# Patient Record
Sex: Female | Born: 1945 | Race: White | Hispanic: No | Marital: Married | Smoking: Former smoker
Health system: Southern US, Community
[De-identification: ages and names within clinical notes are randomized; demographics above are authoritative.]

## PROBLEM LIST (undated history)

## (undated) DIAGNOSIS — F419 Anxiety disorder, unspecified: Secondary | ICD-10-CM

## (undated) DIAGNOSIS — F329 Major depressive disorder, single episode, unspecified: Secondary | ICD-10-CM

## (undated) DIAGNOSIS — M1990 Unspecified osteoarthritis, unspecified site: Secondary | ICD-10-CM

## (undated) DIAGNOSIS — I10 Essential (primary) hypertension: Secondary | ICD-10-CM

## (undated) DIAGNOSIS — G4730 Sleep apnea, unspecified: Secondary | ICD-10-CM

## (undated) DIAGNOSIS — E119 Type 2 diabetes mellitus without complications: Secondary | ICD-10-CM

## (undated) DIAGNOSIS — L121 Cicatricial pemphigoid: Secondary | ICD-10-CM

## (undated) DIAGNOSIS — F32A Depression, unspecified: Secondary | ICD-10-CM

## (undated) DIAGNOSIS — N10 Acute tubulo-interstitial nephritis: Secondary | ICD-10-CM

## (undated) DIAGNOSIS — E785 Hyperlipidemia, unspecified: Secondary | ICD-10-CM

## (undated) DIAGNOSIS — Z87442 Personal history of urinary calculi: Secondary | ICD-10-CM

## (undated) DIAGNOSIS — D500 Iron deficiency anemia secondary to blood loss (chronic): Secondary | ICD-10-CM

## (undated) HISTORY — DX: Hyperlipidemia, unspecified: E78.5

## (undated) HISTORY — DX: Depression, unspecified: F32.A

## (undated) HISTORY — DX: Cicatricial pemphigoid: L12.1

## (undated) HISTORY — DX: Essential (primary) hypertension: I10

## (undated) HISTORY — DX: Major depressive disorder, single episode, unspecified: F32.9

## (undated) HISTORY — PX: REPLACEMENT TOTAL KNEE BILATERAL: SUR1225

## (undated) HISTORY — PX: TONSILLECTOMY: SUR1361

## (undated) HISTORY — DX: Iron deficiency anemia secondary to blood loss (chronic): D50.0

## (undated) HISTORY — PX: TOTAL HIP ARTHROPLASTY: SHX124

## (undated) HISTORY — PX: CHOLECYSTECTOMY: SHX55

## (undated) HISTORY — DX: Anxiety disorder, unspecified: F41.9

---

## 2012-03-24 DIAGNOSIS — L121 Cicatricial pemphigoid: Secondary | ICD-10-CM

## 2012-03-24 HISTORY — DX: Cicatricial pemphigoid: L12.1

## 2012-10-22 HISTORY — PX: COLONOSCOPY: SHX174

## 2015-03-25 HISTORY — PX: LITHOTRIPSY: SUR834

## 2015-09-17 DIAGNOSIS — Z96641 Presence of right artificial hip joint: Secondary | ICD-10-CM | POA: Insufficient documentation

## 2015-09-17 DIAGNOSIS — Z96653 Presence of artificial knee joint, bilateral: Secondary | ICD-10-CM | POA: Insufficient documentation

## 2017-07-22 ENCOUNTER — Encounter: Payer: Self-pay | Admitting: Gastroenterology

## 2017-10-19 ENCOUNTER — Telehealth: Payer: Self-pay

## 2017-10-19 ENCOUNTER — Encounter: Payer: Self-pay | Admitting: Gastroenterology

## 2017-10-19 ENCOUNTER — Other Ambulatory Visit: Payer: Self-pay

## 2017-10-19 ENCOUNTER — Encounter

## 2017-10-19 ENCOUNTER — Ambulatory Visit (INDEPENDENT_AMBULATORY_CARE_PROVIDER_SITE_OTHER): Payer: Medicare Other | Admitting: Gastroenterology

## 2017-10-19 DIAGNOSIS — D500 Iron deficiency anemia secondary to blood loss (chronic): Secondary | ICD-10-CM

## 2017-10-19 DIAGNOSIS — R1011 Right upper quadrant pain: Secondary | ICD-10-CM | POA: Diagnosis not present

## 2017-10-19 DIAGNOSIS — Z800 Family history of malignant neoplasm of digestive organs: Secondary | ICD-10-CM

## 2017-10-19 DIAGNOSIS — D509 Iron deficiency anemia, unspecified: Secondary | ICD-10-CM | POA: Insufficient documentation

## 2017-10-19 DIAGNOSIS — R195 Other fecal abnormalities: Secondary | ICD-10-CM | POA: Diagnosis not present

## 2017-10-19 MED ORDER — NA SULFATE-K SULFATE-MG SULF 17.5-3.13-1.6 GM/177ML PO SOLN: 1.0000 | ORAL | 0 refills | Status: DC

## 2017-10-19 NOTE — Progress Notes (Signed)
Primary Care Physician:  Earney Mallet, MD  Primary Gastroenterologist:  Barney Drain, MD   Chief Complaint  Patient presents with  . Abdominal Pain    ruq; comes and goes  . positive occult blood    last tcs 5 yrs ago, due now for tcs    HPI:  Felicia Hogan is a 72 y.o. female here for further evaluation of iron deficiency anemia, Hemoccult positive stool, right upper quadrant pain at the request of Dr. Macarthur Critchley.  Patient states last summer she developed acute onset severe indigestion associated with right upper quadrant pain.  Remote cholecystectomy.  There was concern about possibility of common bile duct stone based on symptoms.  Patient reports having right upper quadrant ultrasound which was unremarkable.  According to the notes from PCP, she had fatty liver. Since that time her pain has settled down, less frequent.  Typically brought on by fatty meal.  She saw her PCP back in April and it was noted that she had a change in her hemoglobin as well as MCV, both declining.  With regards to bowel function, she is always had multiple stools since being on metformin for the past 5 years.  Denies nocturnal stools.  No melena or rectal bleeding.  No nausea or vomiting.  If she eats after dinner she will develop nocturnal reflux for which she takes Zantac.  Dysphagia.  No prior EGD.  Rarely takes Advil for headache.  No aspirin.  Colonoscopy 5 years ago unremarkable.  Father had colon cancer at age 21.  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  Can involve any of the mucous membranes.  Followed at [facility].  See April 2019 labs as outlined below.  In addition according to Dr. Karna Christmas notes, her hemoglobin declined from 12.1-10.4 over 58-month period of time.  Prior to that was 12.7.  MCV declined from 80.4 down to 76.9 but prior to 6 months ago it was 87.2.  Exact dates are not available to me.  Current Outpatient Medications   Medication Sig Dispense Refill  . amLODipine (NORVASC) 5 MG tablet Take 1 tablet by mouth daily.    Marland Kitchen atorvastatin (LIPITOR) 20 MG tablet Take 1 tablet by mouth daily.    . Cholecalciferol (VITAMIN D3) 5000 units TABS Take 1 tablet by mouth daily.    Marland Kitchen desvenlafaxine (PRISTIQ) 100 MG 24 hr tablet Take 50 mg by mouth daily.    Marland Kitchen dexamethasone (DECADRON) 0.5 MG/5ML solution Take 5 mLs by mouth as needed.    . fenofibrate 160 MG tablet Take 1 tablet by mouth daily.    . fluocinonide gel (LIDEX) 7.90 % Apply 1 application topically as needed.    . metFORMIN (GLUCOPHAGE) 1000 MG tablet Take 2,500 mg by mouth daily.    . Multiple Vitamin (MULTIVITAMIN) tablet Take 1 tablet by mouth daily.    . Multiple Vitamins-Minerals (PRESERVISION AREDS PO) Take by mouth 2 (two) times daily.    Marland Kitchen olmesartan-hydrochlorothiazide (BENICAR HCT) 20-12.5 MG tablet Take 1 tablet by mouth daily.    . Omega-3 Fatty Acids (FISH OIL) 1200 MG CAPS Take 1 capsule by mouth 3 (three) times daily.    . ranitidine (ZANTAC) 150 MG tablet Take 150 mg by mouth as needed for heartburn.    . tamsulosin (FLOMAX) 0.4 MG CAPS capsule Take 1 capsule by mouth daily.     No current facility-administered medications for this visit.  Allergies as of 10/19/2017 - Review Complete 10/19/2017  Allergen Reaction Noted  . Nsaids Itching 10/19/2017  . Erythromycin Hives and Rash 10/19/2017  . Sulfa antibiotics Rash 10/19/2017  . Tape Itching and Rash 10/19/2017    Past Medical History:  Diagnosis Date  . Anxiety   . Depression   . Dyslipidemia   . Hypertension   . Iron deficiency anemia due to chronic blood loss   . Mucous membrane pemphigoid 2014   so far only infects mouth. treated with oral steroids for flares. Lidex as well.   . Nephrolithiasis     Past Surgical History:  Procedure Laterality Date  . CESAREAN SECTION     twice  . CHOLECYSTECTOMY    . COLONOSCOPY  10/2012   Dr. West Carbo: normal. due 10/2017.   Marland Kitchen  LITHOTRIPSY  2017  . REPLACEMENT TOTAL KNEE BILATERAL    . TONSILLECTOMY    . TOTAL HIP ARTHROPLASTY Right     Family History  Problem Relation Age of Onset  . Liver cancer Mother        ?started in pancreas  . Parkinson's disease Father   . Pneumonia Father   . Colon cancer Father 52       metastatic but did well  . Thyroid cancer Father   . Other Daughter 10       brain tumor, followed with serial MRIs. now age 90  . Colon cancer Paternal Aunt 90    Social History   Socioeconomic History  . Marital status: Married    Spouse name: Not on file  . Number of children: Not on file  . Years of education: Not on file  . Highest education level: Not on file  Occupational History  . Not on file  Social Needs  . Financial resource strain: Not on file  . Food insecurity:    Worry: Not on file    Inability: Not on file  . Transportation needs:    Medical: Not on file    Non-medical: Not on file  Tobacco Use  . Smoking status: Former Research scientist (life sciences)  . Smokeless tobacco: Never Used  Substance and Sexual Activity  . Alcohol use: Yes    Comment: rare  . Drug use: Never  . Sexual activity: Not on file  Lifestyle  . Physical activity:    Days per week: Not on file    Minutes per session: Not on file  . Stress: Not on file  Relationships  . Social connections:    Talks on phone: Not on file    Gets together: Not on file    Attends religious service: Not on file    Active member of club or organization: Not on file    Attends meetings of clubs or organizations: Not on file    Relationship status: Not on file  . Intimate partner violence:    Fear of current or ex partner: Not on file    Emotionally abused: Not on file    Physically abused: Not on file    Forced sexual activity: Not on file  Other Topics Concern  . Not on file  Social History Narrative  . Not on file      ROS:  General: Negative for anorexia, weight loss, fever, chills, fatigue, weakness. Eyes: Negative  for vision changes.  ENT: Negative for hoarseness, difficulty swallowing , nasal congestion. See hpi CV: Negative for chest pain, angina, palpitations, dyspnea on exertion, peripheral edema.  Respiratory: Negative for dyspnea at rest,  dyspnea on exertion, cough, sputum, wheezing.  GI: See history of present illness. GU:  Negative for dysuria, hematuria, urinary incontinence, urinary frequency, nocturnal urination.  MS: Negative for joint pain, low back pain.  Instability related to joint replacements Derm: Negative for rash or itching.  Intermittent oral ulcers Neuro: Negative for weakness, abnormal sensation, seizure, frequent headaches, memory loss, confusion.  Psych: Negative for anxiety, depression, suicidal ideation, hallucinations.  Endo: Negative for unusual weight change.  Heme: Negative for bruising or bleeding. Allergy: Negative for rash or hives.    Physical Examination:  BP 123/63   Pulse 99   Temp 97.6 F (36.4 C) (Oral)   Ht 5\' 4"  (1.626 m)   Wt 286 lb 6.4 oz (129.9 kg)   BMI 49.16 kg/m    General: Well-nourished, well-developed in no acute distress.  Obese. Head: Normocephalic, atraumatic.   Eyes: Conjunctiva pink, no icterus. Mouth: Oropharyngeal mucosa moist and pink , no lesions erythema or exudate. Neck: Supple without thyromegaly, masses, or lymphadenopathy.  Lungs: Clear to auscultation bilaterally.  Heart: Regular rate and rhythm, no murmurs rubs or gallops.  Abdomen: Bowel sounds are normal, nontender, nondistended, no hepatosplenomegaly or masses, no abdominal bruits or    hernia , no rebound or guarding.  Exam limited by body habitus Rectal: Not performed Extremities: Trace bilateral lower extremity edema. No clubbing or deformities.  Neuro: Alert and oriented x 4 , grossly normal neurologically.  Skin: Warm and dry, no rash or jaundice.   Psych: Alert and cooperative, normal mood and affect.  Labs: Labs from July 10, 2017: White blood cell count  8800, hemoglobin 10.4 low, hematocrit 34.7 normal, MCV 76.9 low, platelets 433,000.  Imaging Studies: No results found.

## 2017-10-19 NOTE — Patient Instructions (Signed)
1. Colonoscopy and upper endoscopy as scheduled. See separate instructions.  2. Your labs will be updated when you go for your pre-op visit.

## 2017-10-19 NOTE — Telephone Encounter (Signed)
Called and informed pt of pre-op appt 12/22/17 at 10:00am. Letter mailed.

## 2017-10-20 ENCOUNTER — Telehealth: Payer: Self-pay | Admitting: Gastroenterology

## 2017-10-20 ENCOUNTER — Other Ambulatory Visit: Payer: Self-pay

## 2017-10-20 DIAGNOSIS — D509 Iron deficiency anemia, unspecified: Secondary | ICD-10-CM

## 2017-10-20 NOTE — Progress Notes (Signed)
cc'ed to pcp °

## 2017-10-20 NOTE — Telephone Encounter (Signed)
Please have patient complete labs now since her TCS/EGD are not until first of 12/2017.   She needs CBC with diff, iron/ferritin/tibc.

## 2017-10-20 NOTE — Telephone Encounter (Signed)
Pt is aware and lab orders have been faxed to her to do at [facility] in [place], [place], with a note to fax results to our number.

## 2017-10-20 NOTE — Assessment & Plan Note (Signed)
72 year old female presenting for further evaluation of iron deficiency anemia, Hemoccult positive stool, right upper quadrant pain.  Intermittent right upper quadrant pain for over a year.  Work-up last year included ultrasound and reportedly had fatty liver.  As outlined above her hemoglobin/hematocrit, MCV all have declined over the past 6 months.  Patient has a family history of colon cancer, father in his 49s.  Last colonoscopy over 5 years ago.  At this time would recommend colonoscopy and upper endoscopy for evaluation of iron deficiency anemia with occult blood loss, right upper quadrant pain.  Deep sedation planned given polypharmacy, obesity and did well with this means of sedation in the past.  I have discussed the risks, alternatives, benefits with regards to but not limited to the risk of reaction to medication, bleeding, infection, perforation and the patient is agreeable to proceed. Written consent to be obtained.   Originally had plan on updating labs during her preop visit however her procedure is scheduled further out than anticipated therefore we will go ahead and update.

## 2017-10-29 LAB — CBC WITH DIFFERENTIAL/PLATELET
Basophils Absolute: 0.1 10*3/uL (ref 0.0–0.2)
Basos: 1 %
EOS (ABSOLUTE): 0.7 10*3/uL — ABNORMAL HIGH (ref 0.0–0.4)
Eos: 9 %
Hematocrit: 33.4 % — ABNORMAL LOW (ref 34.0–46.6)
Hemoglobin: 10.3 g/dL — ABNORMAL LOW (ref 11.1–15.9)
IMMATURE GRANS (ABS): 0 10*3/uL (ref 0.0–0.1)
Immature Granulocytes: 1 %
LYMPHS: 23 %
Lymphocytes Absolute: 1.8 10*3/uL (ref 0.7–3.1)
MCH: 22.5 pg — AB (ref 26.6–33.0)
MCHC: 30.8 g/dL — AB (ref 31.5–35.7)
MCV: 73 fL — ABNORMAL LOW (ref 79–97)
Monocytes Absolute: 0.8 10*3/uL (ref 0.1–0.9)
Monocytes: 9 %
NEUTROS ABS: 4.5 10*3/uL (ref 1.4–7.0)
Neutrophils: 57 %
Platelets: 419 10*3/uL (ref 150–450)
RBC: 4.57 x10E6/uL (ref 3.77–5.28)
RDW: 18.3 % — ABNORMAL HIGH (ref 12.3–15.4)
WBC: 8 10*3/uL (ref 3.4–10.8)

## 2017-10-29 LAB — IRON,TIBC AND FERRITIN PANEL
Ferritin: 9 ng/mL — ABNORMAL LOW (ref 15–150)
Iron Saturation: 6 % — CL (ref 15–55)
Iron: 29 ug/dL (ref 27–139)
TIBC: 491 ug/dL — AB (ref 250–450)
UIBC: 462 ug/dL — ABNORMAL HIGH (ref 118–369)

## 2017-11-09 NOTE — Progress Notes (Signed)
LMOM to call.

## 2017-11-09 NOTE — Progress Notes (Signed)
PT is aware. She is aware we will try to move appt for procedures earlier. Forwarding to [facility] to check out.

## 2017-11-12 ENCOUNTER — Telehealth: Payer: Self-pay

## 2017-11-12 NOTE — Patient Instructions (Signed)
Felicia Hogan  11/12/2017     @PREFPERIOPPHARMACY @   Your procedure is scheduled on  11/17/2017.  Report to [name] at  915   A.M.  Call this number if you have problems the morning of surgery:  [phone]   Remember:  Do not eat or drink after midnight.  You may drink clear liquids until ( follow the instructions given to you).  Clear liquids allowed are:                    Water, Juice (non-citric and without pulp), Carbonated beverages, Clear Tea, Black Coffee only, Plain Jell-O only, Gatorade and Plain Popsicles only    Take these medicines the morning of surgery with A SIP OF WATER  Amlodipine, zyrtec, pristiq, decadron, benicar.    Do not wear jewelry, make-up or nail polish.  Do not wear lotions, powders, or perfumes, or deodorant.  Do not shave 48 hours prior to surgery.  Men may shave face and neck.  Do not bring valuables to the hospital.  [facility] is not responsible for any belongings or valuables.  Contacts, dentures or bridgework may not be worn into surgery.  Leave your suitcase in the car.  After surgery it may be brought to your room.  For patients admitted to the hospital, discharge time will be determined by your treatment team.  Patients discharged the day of surgery will not be allowed to drive home.   Name and phone number of your driver:   family Special instructions:  Follow the diet and prep instructions given to you by Dr [name] office.  Please read over the following fact sheets that you were given. Anesthesia Post-op Instructions and Care and Recovery After Surgery       Esophagogastroduodenoscopy Esophagogastroduodenoscopy (EGD) is a procedure to examine the lining of the esophagus, stomach, and first part of the small intestine (duodenum). This procedure is done to check for problems such as inflammation, bleeding, ulcers, or growths. During this procedure, a long, flexible, lighted tube with a camera attached  (endoscope) is inserted down the throat. Tell a health care provider about:  Any allergies you have.  All medicines you are taking, including vitamins, herbs, eye drops, creams, and over-the-counter medicines.  Any problems you or family members have had with anesthetic medicines.  Any blood disorders you have.  Any surgeries you have had.  Any medical conditions you have.  Whether you are pregnant or may be pregnant. What are the risks? Generally, this is a safe procedure. However, problems may occur, including:  Infection.  Bleeding.  A tear (perforation) in the esophagus, stomach, or duodenum.  Trouble breathing.  Excessive sweating.  Spasms of the larynx.  A slowed heartbeat.  Low blood pressure.  What happens before the procedure?  Follow instructions from your health care provider about eating or drinking restrictions.  Ask your health care provider about: ? Changing or stopping your regular medicines. This is especially important if you are taking diabetes medicines or blood thinners. ? Taking medicines such as aspirin and ibuprofen. These medicines can thin your blood. Do not take these medicines before your procedure if your health care provider instructs you not to.  Plan to have someone take you home after the procedure.  If you wear dentures, be ready to remove them before the procedure. What happens during the procedure?  To reduce your risk of infection,  your health care team will wash or sanitize their hands.  An IV tube will be put in a vein in your hand or arm. You will get medicines and fluids through this tube.  You will be given one or more of the following: ? A medicine to help you relax (sedative). ? A medicine to numb the area (local anesthetic). This medicine may be sprayed into your throat. It will make you feel more comfortable and keep you from gagging or coughing during the procedure. ? A medicine for pain.  A mouth guard may be  placed in your mouth to protect your teeth and to keep you from biting on the endoscope.  You will be asked to lie on your left side.  The endoscope will be lowered down your throat into your esophagus, stomach, and duodenum.  Air will be put into the endoscope. This will help your health care provider see better.  The lining of your esophagus, stomach, and duodenum will be examined.  Your health care provider may: ? Take a tissue sample so it can be looked at in a lab (biopsy). ? Remove growths. ? Remove objects (foreign bodies) that are stuck. ? Treat any bleeding with medicines or other devices that stop tissue from bleeding. ? Widen (dilate) or stretch narrowed areas of your esophagus and stomach.  The endoscope will be taken out. The procedure may vary among health care providers and hospitals. What happens after the procedure?  Your blood pressure, heart rate, breathing rate, and blood oxygen level will be monitored often until the medicines you were given have worn off.  Do not eat or drink anything until the numbing medicine has worn off and your gag reflex has returned. This information is not intended to replace advice given to you by your health care provider. Make sure you discuss any questions you have with your health care provider. Document Released: 07/11/2004 Document Revised: 08/16/2015 Document Reviewed: 02/01/2015 Elsevier Interactive Patient Education  2018 [employer]. Esophagogastroduodenoscopy, Care After Refer to this sheet in the next few weeks. These instructions provide you with information about caring for yourself after your procedure. Your health care provider may also give you more specific instructions. Your treatment has been planned according to current medical practices, but problems sometimes occur. Call your health care provider if you have any problems or questions after your procedure. What can I expect after the procedure? After the procedure,  it is common to have:  A sore throat.  Nausea.  Bloating.  Dizziness.  Fatigue.  Follow these instructions at home:  Do not eat or drink anything until the numbing medicine (local anesthetic) has worn off and your gag reflex has returned. You will know that the local anesthetic has worn off when you can swallow comfortably.  Do not drive for 24 hours if you received a medicine to help you relax (sedative).  If your health care provider took a tissue sample for testing during the procedure, make sure to get your test results. This is your responsibility. Ask your health care provider or the department performing the test when your results will be ready.  Keep all follow-up visits as told by your health care provider. This is important. Contact a health care provider if:  You cannot stop coughing.  You are not urinating.  You are urinating less than usual. Get help right away if:  You have trouble swallowing.  You cannot eat or drink.  You have throat or chest pain that  gets worse.  You are dizzy or light-headed.  You faint.  You have nausea or vomiting.  You have chills.  You have a fever.  You have severe abdominal pain.  You have black, tarry, or bloody stools. This information is not intended to replace advice given to you by your health care provider. Make sure you discuss any questions you have with your health care provider. Document Released: 02/25/2012 Document Revised: 08/16/2015 Document Reviewed: 02/01/2015 Elsevier Interactive Patient Education  2018 [employer].  Colonoscopy, Adult A colonoscopy is an exam to look at the large intestine. It is done to check for problems, such as:  Lumps (tumors).  Growths (polyps).  Swelling (inflammation).  Bleeding.  What happens before the procedure? Eating and drinking Follow instructions from your doctor about eating and drinking. These instructions may include:  A few days before the procedure -  follow a low-fiber diet. ? Avoid nuts. ? Avoid seeds. ? Avoid dried fruit. ? Avoid raw fruits. ? Avoid vegetables.  1-3 days before the procedure - follow a clear liquid diet. Avoid liquids that have red or purple dye. Drink only clear liquids, such as: ? Clear broth or bouillon. ? Black coffee or tea. ? Clear juice. ? Clear soft drinks or sports drinks. ? Gelatin dessert. ? Popsicles.  On the day of the procedure - do not eat or drink anything during the 2 hours before the procedure.  Bowel prep If you were prescribed an oral bowel prep:  Take it as told by your doctor. Starting the day before your procedure, you will need to drink a lot of liquid. The liquid will cause you to poop (have bowel movements) until your poop is almost clear or light green.  If your skin or butt gets irritated from diarrhea, you may: ? Wipe the area with wipes that have medicine in them, such as adult wet wipes with aloe and vitamin E. ? Put something on your skin that soothes the area, such as petroleum jelly.  If you throw up (vomit) while drinking the bowel prep, take a break for up to 60 minutes. Then begin the bowel prep again. If you keep throwing up and you cannot take the bowel prep without throwing up, call your doctor.  General instructions  Ask your doctor about changing or stopping your normal medicines. This is important if you take diabetes medicines or blood thinners.  Plan to have someone take you home from the hospital or clinic. What happens during the procedure?  An IV tube may be put into one of your veins.  You will be given medicine to help you relax (sedative).  To reduce your risk of infection: ? Your doctors will wash their hands. ? Your anal area will be washed with soap.  You will be asked to lie on your side with your knees bent.  Your doctor will get a long, thin, flexible tube ready. The tube will have a camera and a light on the end.  The tube will be put into  your anus.  The tube will be gently put into your large intestine.  Air will be delivered into your large intestine to keep it open. You may feel some pressure or cramping.  The camera will be used to take photos.  A small tissue sample may be removed from your body to be looked at under a microscope (biopsy). If any possible problems are found, the tissue will be sent to a lab for testing.  If  small growths are found, your doctor may remove them and have them checked for cancer.  The tube that was put into your anus will be slowly removed. The procedure may vary among doctors and hospitals. What happens after the procedure?  Your doctor will check on you often until the medicines you were given have worn off.  Do not drive for 24 hours after the procedure.  You may have a small amount of blood in your poop.  You may pass gas.  You may have mild cramps or bloating in your belly (abdomen).  It is up to you to get the results of your procedure. Ask your doctor, or the department performing the procedure, when your results will be ready. This information is not intended to replace advice given to you by your health care provider. Make sure you discuss any questions you have with your health care provider. Document Released: 04/12/2010 Document Revised: 01/09/2016 Document Reviewed: 05/22/2015 Elsevier Interactive Patient Education  2017 [employer]  Colonoscopy, Adult, Care After This sheet gives you information about how to care for yourself after your procedure. Your health care provider may also give you more specific instructions. If you have problems or questions, contact your health care provider. What can I expect after the procedure? After the procedure, it is common to have:  A small amount of blood in your stool for 24 hours after the procedure.  Some gas.  Mild abdominal cramping or bloating.  Follow these instructions at home: General instructions   For the  first 24 hours after the procedure: ? Do not drive or use machinery. ? Do not sign important documents. ? Do not drink alcohol. ? Do your regular daily activities at a slower pace than normal. ? Eat soft, easy-to-digest foods. ? Rest often.  Take over-the-counter or prescription medicines only as told by your health care provider.  It is up to you to get the results of your procedure. Ask your health care provider, or the department performing the procedure, when your results will be ready. Relieving cramping and bloating  Try walking around when you have cramps or feel bloated.  Apply heat to your abdomen as told by your health care provider. Use a heat source that your health care provider recommends, such as a moist heat pack or a heating pad. ? Place a towel between your skin and the heat source. ? Leave the heat on for 20-30 minutes. ? Remove the heat if your skin turns bright red. This is especially important if you are unable to feel pain, heat, or cold. You may have a greater risk of getting burned. Eating and drinking  Drink enough fluid to keep your urine clear or pale yellow.  Resume your normal diet as instructed by your health care provider. Avoid heavy or fried foods that are hard to digest.  Avoid drinking alcohol for as long as instructed by your health care provider. Contact a health care provider if:  You have blood in your stool 2-3 days after the procedure. Get help right away if:  You have more than a small spotting of blood in your stool.  You pass large blood clots in your stool.  Your abdomen is swollen.  You have nausea or vomiting.  You have a fever.  You have increasing abdominal pain that is not relieved with medicine. This information is not intended to replace advice given to you by your health care provider. Make sure you discuss any questions you have  with your health care provider. Document Released: 10/23/2003 Document Revised: 12/03/2015  Document Reviewed: 05/22/2015 Elsevier Interactive Patient Education  2018 [place] Anesthesia is a term that refers to techniques, procedures, and medicines that help a person stay safe and comfortable during a medical procedure. Monitored anesthesia care, or sedation, is one type of anesthesia. Your anesthesia specialist may recommend sedation if you will be having a procedure that does not require you to be unconscious, such as:  Cataract surgery.  A dental procedure.  A biopsy.  A colonoscopy.  During the procedure, you may receive a medicine to help you relax (sedative). There are three levels of sedation:  Mild sedation. At this level, you may feel awake and relaxed. You will be able to follow directions.  Moderate sedation. At this level, you will be sleepy. You may not remember the procedure.  Deep sedation. At this level, you will be asleep. You will not remember the procedure.  The more medicine you are given, the deeper your level of sedation will be. Depending on how you respond to the procedure, the anesthesia specialist may change your level of sedation or the type of anesthesia to fit your needs. An anesthesia specialist will monitor you closely during the procedure. Let your health care provider know about:  Any allergies you have.  All medicines you are taking, including vitamins, herbs, eye drops, creams, and over-the-counter medicines.  Any use of steroids (by mouth or as a cream).  Any problems you or family members have had with sedatives and anesthetic medicines.  Any blood disorders you have.  Any surgeries you have had.  Any medical conditions you have, such as sleep apnea.  Whether you are pregnant or may be pregnant.  Any use of cigarettes, alcohol, or street drugs. What are the risks? Generally, this is a safe procedure. However, problems may occur, including:  Getting too much medicine  (oversedation).  Nausea.  Allergic reaction to medicines.  Trouble breathing. If this happens, a breathing tube may be used to help with breathing. It will be removed when you are awake and breathing on your own.  Heart trouble.  Lung trouble.  Before the procedure Staying hydrated Follow instructions from your health care provider about hydration, which may include:  Up to 2 hours before the procedure - you may continue to drink clear liquids, such as water, clear fruit juice, black coffee, and plain tea.  Eating and drinking restrictions Follow instructions from your health care provider about eating and drinking, which may include:  8 hours before the procedure - stop eating heavy meals or foods such as meat, fried foods, or fatty foods.  6 hours before the procedure - stop eating light meals or foods, such as toast or cereal.  6 hours before the procedure - stop drinking milk or drinks that contain milk.  2 hours before the procedure - stop drinking clear liquids.  Medicines Ask your health care provider about:  Changing or stopping your regular medicines. This is especially important if you are taking diabetes medicines or blood thinners.  Taking medicines such as aspirin and ibuprofen. These medicines can thin your blood. Do not take these medicines before your procedure if your health care provider instructs you not to.  Tests and exams  You will have a physical exam.  You may have blood tests done to show: ? How well your kidneys and liver are working. ? How well your blood can clot.  General  instructions  Plan to have someone take you home from the hospital or clinic.  If you will be going home right after the procedure, plan to have someone with you for 24 hours.  What happens during the procedure?  Your blood pressure, heart rate, breathing, level of pain and overall condition will be monitored.  An IV tube will be inserted into one of your  veins.  Your anesthesia specialist will give you medicines as needed to keep you comfortable during the procedure. This may mean changing the level of sedation.  The procedure will be performed. After the procedure  Your blood pressure, heart rate, breathing rate, and blood oxygen level will be monitored until the medicines you were given have worn off.  Do not drive for 24 hours if you received a sedative.  You may: ? Feel sleepy, clumsy, or nauseous. ? Feel forgetful about what happened after the procedure. ? Have a sore throat if you had a breathing tube during the procedure. ? Vomit. This information is not intended to replace advice given to you by your health care provider. Make sure you discuss any questions you have with your health care provider. Document Released: 12/04/2004 Document Revised: 08/17/2015 Document Reviewed: 07/01/2015 Elsevier Interactive Patient Education  2018 [place], Care After These instructions provide you with information about caring for yourself after your procedure. Your health care provider may also give you more specific instructions. Your treatment has been planned according to current medical practices, but problems sometimes occur. Call your health care provider if you have any problems or questions after your procedure. What can I expect after the procedure? After your procedure, it is common to:  Feel sleepy for several hours.  Feel clumsy and have poor balance for several hours.  Feel forgetful about what happened after the procedure.  Have poor judgment for several hours.  Feel nauseous or vomit.  Have a sore throat if you had a breathing tube during the procedure.  Follow these instructions at home: For at least 24 hours after the procedure:   Do not: ? Participate in activities in which you could fall or become injured. ? Drive. ? Use heavy machinery. ? Drink alcohol. ? Take sleeping pills or  medicines that cause drowsiness. ? Make important decisions or sign legal documents. ? Take care of children on your own.  Rest. Eating and drinking  Follow the diet that is recommended by your health care provider.  If you vomit, drink water, juice, or soup when you can drink without vomiting.  Make sure you have little or no nausea before eating solid foods. General instructions  Have a responsible adult stay with you until you are awake and alert.  Take over-the-counter and prescription medicines only as told by your health care provider.  If you smoke, do not smoke without supervision.  Keep all follow-up visits as told by your health care provider. This is important. Contact a health care provider if:  You keep feeling nauseous or you keep vomiting.  You feel light-headed.  You develop a rash.  You have a fever. Get help right away if:  You have trouble breathing. This information is not intended to replace advice given to you by your health care provider. Make sure you discuss any questions you have with your health care provider. Document Released: 07/01/2015 Document Revised: 10/31/2015 Document Reviewed: 07/01/2015 Elsevier Interactive Patient Education  [employer].

## 2017-11-12 NOTE — Telephone Encounter (Signed)
Endo scheduler called office, TCS/EGD for 11/17/17 moved up to 10:45am. Pt will be told at pre-op appt today.

## 2017-11-13 ENCOUNTER — Encounter (HOSPITAL_COMMUNITY)
Admission: RE | Admit: 2017-11-13 | Discharge: 2017-11-13 | Disposition: A | Discharge status: Home / Self Care | Payer: Medicare Other | Source: Ambulatory Visit | Attending: Gastroenterology | Admitting: Gastroenterology

## 2017-11-13 ENCOUNTER — Other Ambulatory Visit: Payer: Self-pay

## 2017-11-13 ENCOUNTER — Encounter (HOSPITAL_COMMUNITY): Payer: Self-pay

## 2017-11-13 DIAGNOSIS — Z01812 Encounter for preprocedural laboratory examination: Secondary | ICD-10-CM | POA: Diagnosis present

## 2017-11-13 DIAGNOSIS — Z01810 Encounter for preprocedural cardiovascular examination: Secondary | ICD-10-CM | POA: Diagnosis not present

## 2017-11-13 HISTORY — DX: Type 2 diabetes mellitus without complications: E11.9

## 2017-11-13 HISTORY — DX: Unspecified osteoarthritis, unspecified site: M19.90

## 2017-11-13 HISTORY — DX: Sleep apnea, unspecified: G47.30

## 2017-11-13 HISTORY — DX: Personal history of urinary calculi: Z87.442

## 2017-11-13 LAB — BASIC METABOLIC PANEL
Anion gap: 11 (ref 5–15)
BUN: 15 mg/dL (ref 8–23)
CHLORIDE: 100 mmol/L (ref 98–111)
CO2: 25 mmol/L (ref 22–32)
Calcium: 11.3 mg/dL — ABNORMAL HIGH (ref 8.9–10.3)
Creatinine, Ser: 0.8 mg/dL (ref 0.44–1.00)
GFR calc Af Amer: >60 mL/min (ref >60)
GFR calc non Af Amer: >60 mL/min (ref >60)
Glucose, Bld: 139 mg/dL — ABNORMAL HIGH (ref 70–99)
POTASSIUM: 3.9 mmol/L (ref 3.5–5.1)
SODIUM: 136 mmol/L (ref 135–145)

## 2017-11-13 LAB — HEPATIC FUNCTION PANEL
ALT: 29 U/L (ref 0–44)
AST: 19 U/L (ref 15–41)
Albumin: 3.9 g/dL (ref 3.5–5.0)
Alkaline Phosphatase: 223 U/L — ABNORMAL HIGH (ref 38–126)
BILIRUBIN DIRECT: 0.2 mg/dL (ref 0.0–0.2)
BILIRUBIN TOTAL: 1 mg/dL (ref 0.3–1.2)
Indirect Bilirubin: 0.8 mg/dL (ref 0.3–0.9)
Total Protein: 7.7 g/dL (ref 6.5–8.1)

## 2017-11-16 NOTE — Progress Notes (Signed)
Pt is aware and would like labs sent to PCP. She will discuss with him and let us know if she would like a referral to [facility], please send copies to PCP.

## 2017-11-17 ENCOUNTER — Ambulatory Visit (HOSPITAL_COMMUNITY)
Admission: RE | Admit: 2017-11-17 | Discharge: 2017-11-17 | Disposition: A | Discharge status: Home / Self Care | Payer: Medicare Other | Source: Ambulatory Visit | Attending: Gastroenterology | Admitting: Gastroenterology

## 2017-11-17 ENCOUNTER — Ambulatory Visit (HOSPITAL_COMMUNITY): Payer: Medicare Other | Admitting: Anesthesiology

## 2017-11-17 ENCOUNTER — Encounter (HOSPITAL_COMMUNITY): Payer: Self-pay | Admitting: Anesthesiology

## 2017-11-17 ENCOUNTER — Encounter (HOSPITAL_COMMUNITY)
Admission: RE | Disposition: A | Discharge status: Home / Self Care | Payer: Self-pay | Source: Ambulatory Visit | Attending: Gastroenterology

## 2017-11-17 DIAGNOSIS — R1011 Right upper quadrant pain: Secondary | ICD-10-CM | POA: Diagnosis not present

## 2017-11-17 DIAGNOSIS — Q438 Other specified congenital malformations of intestine: Secondary | ICD-10-CM | POA: Diagnosis not present

## 2017-11-17 DIAGNOSIS — T39395A Adverse effect of other nonsteroidal anti-inflammatory drugs [NSAID], initial encounter: Secondary | ICD-10-CM | POA: Insufficient documentation

## 2017-11-17 DIAGNOSIS — F329 Major depressive disorder, single episode, unspecified: Secondary | ICD-10-CM | POA: Insufficient documentation

## 2017-11-17 DIAGNOSIS — Z96641 Presence of right artificial hip joint: Secondary | ICD-10-CM | POA: Insufficient documentation

## 2017-11-17 DIAGNOSIS — K317 Polyp of stomach and duodenum: Secondary | ICD-10-CM | POA: Insufficient documentation

## 2017-11-17 DIAGNOSIS — K449 Diaphragmatic hernia without obstruction or gangrene: Secondary | ICD-10-CM | POA: Diagnosis not present

## 2017-11-17 DIAGNOSIS — Z791 Long term (current) use of non-steroidal anti-inflammatories (NSAID): Secondary | ICD-10-CM | POA: Diagnosis not present

## 2017-11-17 DIAGNOSIS — K2970 Gastritis, unspecified, without bleeding: Secondary | ICD-10-CM

## 2017-11-17 DIAGNOSIS — D123 Benign neoplasm of transverse colon: Secondary | ICD-10-CM | POA: Diagnosis not present

## 2017-11-17 DIAGNOSIS — G4730 Sleep apnea, unspecified: Secondary | ICD-10-CM | POA: Diagnosis not present

## 2017-11-17 DIAGNOSIS — Z7984 Long term (current) use of oral hypoglycemic drugs: Secondary | ICD-10-CM | POA: Insufficient documentation

## 2017-11-17 DIAGNOSIS — E119 Type 2 diabetes mellitus without complications: Secondary | ICD-10-CM | POA: Insufficient documentation

## 2017-11-17 DIAGNOSIS — I10 Essential (primary) hypertension: Secondary | ICD-10-CM | POA: Insufficient documentation

## 2017-11-17 DIAGNOSIS — F419 Anxiety disorder, unspecified: Secondary | ICD-10-CM | POA: Insufficient documentation

## 2017-11-17 DIAGNOSIS — Z96653 Presence of artificial knee joint, bilateral: Secondary | ICD-10-CM | POA: Diagnosis not present

## 2017-11-17 DIAGNOSIS — Z800 Family history of malignant neoplasm of digestive organs: Secondary | ICD-10-CM | POA: Insufficient documentation

## 2017-11-17 DIAGNOSIS — E785 Hyperlipidemia, unspecified: Secondary | ICD-10-CM | POA: Insufficient documentation

## 2017-11-17 DIAGNOSIS — K210 Gastro-esophageal reflux disease with esophagitis: Secondary | ICD-10-CM | POA: Insufficient documentation

## 2017-11-17 DIAGNOSIS — R195 Other fecal abnormalities: Secondary | ICD-10-CM

## 2017-11-17 DIAGNOSIS — D500 Iron deficiency anemia secondary to blood loss (chronic): Secondary | ICD-10-CM

## 2017-11-17 DIAGNOSIS — Z79899 Other long term (current) drug therapy: Secondary | ICD-10-CM | POA: Diagnosis not present

## 2017-11-17 DIAGNOSIS — K648 Other hemorrhoids: Secondary | ICD-10-CM | POA: Diagnosis not present

## 2017-11-17 DIAGNOSIS — K2960 Other gastritis without bleeding: Secondary | ICD-10-CM | POA: Insufficient documentation

## 2017-11-17 DIAGNOSIS — Z87891 Personal history of nicotine dependence: Secondary | ICD-10-CM | POA: Insufficient documentation

## 2017-11-17 DIAGNOSIS — D509 Iron deficiency anemia, unspecified: Secondary | ICD-10-CM | POA: Insufficient documentation

## 2017-11-17 HISTORY — PX: BIOPSY: SHX5522

## 2017-11-17 HISTORY — PX: POLYPECTOMY: SHX5525

## 2017-11-17 HISTORY — PX: ESOPHAGOGASTRODUODENOSCOPY (EGD) WITH PROPOFOL: SHX5813

## 2017-11-17 HISTORY — PX: COLONOSCOPY WITH PROPOFOL: SHX5780

## 2017-11-17 LAB — GLUCOSE, CAPILLARY
Glucose-Capillary: 183 mg/dL — ABNORMAL HIGH (ref 70–99)
Glucose-Capillary: 132 mg/dL — ABNORMAL HIGH (ref 70–99)

## 2017-11-17 SURGERY — COLONOSCOPY WITH PROPOFOL
Anesthesia: General

## 2017-11-17 MED ORDER — PROPOFOL 10 MG/ML IV BOLUS
INTRAVENOUS | Status: DC | PRN
  Administered 2017-11-17: 17 mg via INTRAVENOUS
  Administered 2017-11-17: 10 mg via INTRAVENOUS
  Administered 2017-11-17 (×3): 17 mg via INTRAVENOUS

## 2017-11-17 MED ORDER — GLYCOPYRROLATE 0.2 MG/ML IJ SOLN
INTRAMUSCULAR | Status: DC | PRN
  Administered 2017-11-17: 0.2 mg via INTRAVENOUS

## 2017-11-17 MED ORDER — PROPOFOL 10 MG/ML IV BOLUS
INTRAVENOUS | Status: AC
  Filled 2017-11-17: qty 40

## 2017-11-17 MED ORDER — LIDOCAINE HCL 1 % IJ SOLN
INTRAMUSCULAR | Status: DC | PRN
  Administered 2017-11-17: 40 mg via INTRADERMAL

## 2017-11-17 MED ORDER — FENTANYL CITRATE (PF) 100 MCG/2ML IJ SOLN: 25.0000 ug | INTRAMUSCULAR | Status: DC | PRN

## 2017-11-17 MED ORDER — GLYCOPYRROLATE 0.2 MG/ML IJ SOLN
INTRAMUSCULAR | Status: AC
  Filled 2017-11-17: qty 1

## 2017-11-17 MED ORDER — CHLORHEXIDINE GLUCONATE CLOTH 2 % EX PADS: 6.0000 | MEDICATED_PAD | Freq: Once | CUTANEOUS | Status: DC

## 2017-11-17 MED ORDER — HYDROCODONE-ACETAMINOPHEN 7.5-325 MG PO TABS: 1.0000 | ORAL_TABLET | Freq: Once | ORAL | Status: DC | PRN

## 2017-11-17 MED ORDER — MIDAZOLAM HCL 2 MG/2ML IJ SOLN
INTRAMUSCULAR | Status: AC
  Filled 2017-11-17: qty 2

## 2017-11-17 MED ORDER — MIDAZOLAM HCL 5 MG/5ML IJ SOLN
INTRAMUSCULAR | Status: DC | PRN
  Administered 2017-11-17: 2 mg via INTRAVENOUS

## 2017-11-17 MED ORDER — EPHEDRINE SULFATE 50 MG/ML IJ SOLN
INTRAMUSCULAR | Status: DC | PRN
  Administered 2017-11-17: 5 mg via INTRAVENOUS
  Administered 2017-11-17 (×2): 10 mg via INTRAVENOUS
  Administered 2017-11-17: 5 mg via INTRAVENOUS

## 2017-11-17 MED ORDER — PROPOFOL 500 MG/50ML IV EMUL
INTRAVENOUS | Status: DC | PRN
  Administered 2017-11-17: 125 ug/kg/min via INTRAVENOUS
  Administered 2017-11-17 (×2): via INTRAVENOUS

## 2017-11-17 MED ORDER — LACTATED RINGERS IV SOLN
INTRAVENOUS | Status: DC
  Administered 2017-11-17: 11:00:00 via INTRAVENOUS

## 2017-11-17 MED ORDER — PHENYLEPHRINE HCL 10 MG/ML IJ SOLN
INTRAMUSCULAR | Status: DC | PRN
  Administered 2017-11-17: 60 ug via INTRAVENOUS
  Administered 2017-11-17: 80 ug via INTRAVENOUS
  Administered 2017-11-17: 60 ug via INTRAVENOUS

## 2017-11-17 MED ORDER — LIDOCAINE VISCOUS HCL 2 % MT SOLN
OROMUCOSAL | Status: AC
  Filled 2017-11-17: qty 15

## 2017-11-17 MED ORDER — OMEPRAZOLE 20 MG PO CPDR: DELAYED_RELEASE_CAPSULE | ORAL | 3 refills | Status: DC

## 2017-11-17 MED ORDER — ATROPINE SULFATE 0.4 MG/ML IJ SOLN
INTRAMUSCULAR | Status: AC
  Filled 2017-11-17: qty 1

## 2017-11-17 MED ORDER — PHENYLEPHRINE 40 MCG/ML (10ML) SYRINGE FOR IV PUSH (FOR BLOOD PRESSURE SUPPORT)
PREFILLED_SYRINGE | INTRAVENOUS | Status: AC
  Filled 2017-11-17: qty 10

## 2017-11-17 MED ORDER — LIDOCAINE HCL (PF) 1 % IJ SOLN
INTRAMUSCULAR | Status: AC
  Filled 2017-11-17: qty 5

## 2017-11-17 NOTE — Op Note (Signed)
[facility] Patient Name: Felicia Hogan Procedure Date: 11/17/2017 11:02 AM MRN: [id number] Date of Birth: 1945-03-28 Attending MD: Barney Drain MD, MD CSN: [id number] Age: 72 Admit Type: Outpatient Procedure:                Upper GI endoscopy with COLD FORCEPS BIOPSY Indications:              Iron deficiency anemia: AUG 2019 FERRITIN 9 Hb 10.3                            MCV 73. USE IBUPROFEN W/O PPI. Providers:                Barney Drain MD, MD, Lurline Del, RN, Aram Candela Referring MD:             Earney Mallet Medicines:                Propofol per Anesthesia Complications:            No immediate complications. Estimated Blood Loss:     Estimated blood loss was minimal. Procedure:                Pre-Anesthesia Assessment:                           - Prior to the procedure, a History and Physical                            was performed, and patient medications and                            allergies were reviewed. The patient's tolerance of                            previous anesthesia was also reviewed. The risks                            and benefits of the procedure and the sedation                            options and risks were discussed with the patient.                            All questions were answered, and informed consent                            was obtained. Prior Anticoagulants: The patient has                            taken ibuprofen. ASA Grade Assessment: II - A                            patient with mild systemic disease. After reviewing                            the risks and benefits, the patient was deemed in  satisfactory condition to undergo the procedure.                            After obtaining informed consent, the endoscope was                            passed under direct vision. Throughout the                            procedure, the patient's blood pressure, pulse, and   oxygen saturations were monitored continuously. The                            [id number]-[id number] ([id number]) scope was introduced through the                            mouth, and advanced to the second part of duodenum.                            The upper GI endoscopy was accomplished without                            difficulty. The patient tolerated the procedure                            well. Scope In: 12:05:43 PM Scope Out: 12:15:33 PM Total Procedure Duration: 0 hours 9 minutes 50 seconds  Findings:      LA Grade A (one or more mucosal breaks less than 5 mm, not extending       between tops of 2 mucosal folds) esophagitis with no bleeding was found.      A small hiatal hernia was present.      Patchy mild inflammation characterized by congestion (edema) and       erythema was found in the gastric antrum. Biopsies(2: BODY, 3: ANTRUM)       were taken with a cold forceps for histology FOR ATROPHIC/H PYLORI       GASTRITIS.      Two small sessile polyps were found on the greater curvature of the       stomach. Biopsies(2: BULB: BTL #3, 4: 2ND PORTION: BTL #2) were taken       with a cold forceps for histology.      The examined duodenum was normal. Biopsies for histology were taken with       a cold forceps for evaluation of celiac disease. Impression:               - LA Grade A reflux esophagitis.                           - Small hiatal hernia.                           - RUQ PAIN DUE TO MILD NSAID GASTRITIS                           - Two gastric polyps. Biopsied.                           -  NO OBVIOUS SOURCE FOR IRON DEFICIENCY ANEMIA                            IDENTIFIED. Moderate Sedation:      Per Anesthesia Care Recommendation:           - Patient has a contact number available for                            emergencies. The signs and symptoms of potential                            delayed complications were discussed with the                            patient. Return to normal  activities tomorrow.                            Written discharge instructions were provided to the                            patient.                           - High fiber diet and low fat diet.                           - Continue present medications. ADD OMEPRAZOLE QAC                            BREAKFAST                           - Await pathology results. CONSIDER GIVENS STUDY IF                            NO SOURCE FOR ANEMIA IDENTIFIED.                           - Return to my office in 4 months. Procedure Code(s):        --- Professional ---                           [phone], Esophagogastroduodenoscopy, flexible,                            transoral; with biopsy, single or multiple Diagnosis Code(s):        --- Professional ---                           K21.0, Gastro-esophageal reflux disease with                            esophagitis                           K44.9, Diaphragmatic hernia without obstruction or  gangrene                           K29.70, Gastritis, unspecified, without bleeding                           K31.7, Polyp of stomach and duodenum                           D50.9, Iron deficiency anemia, unspecified CPT copyright 2017 American Medical Association. All rights reserved. The codes documented in this report are preliminary and upon coder review may  be revised to meet current compliance requirements. Barney Drain, MD Barney Drain MD, MD 11/17/2017 12:38:33 PM This report has been signed electronically. Number of Addenda: 0

## 2017-11-17 NOTE — Transfer of Care (Signed)
Immediate Anesthesia Transfer of Care Note  Patient: Felicia Hogan  Procedure(s) Performed: COLONOSCOPY WITH PROPOFOL (N/A ) ESOPHAGOGASTRODUODENOSCOPY (EGD) WITH PROPOFOL (N/A ) POLYPECTOMY BIOPSY  Patient Location: PACU  Anesthesia Type:General  Level of Consciousness: awake, alert  and oriented  Airway & Oxygen Therapy: Patient Spontanous Breathing  Post-op Assessment: Report given to RN  Post vital signs: Reviewed and stable  Last Vitals:  Vitals Value Taken Time  BP    Temp    Pulse 86 11/17/2017 12:24 PM  Resp 26 11/17/2017 12:24 PM  SpO2 94 % 11/17/2017 12:24 PM  Vitals shown include unvalidated device data.  Last Pain:  Vitals:   11/17/17 1005  TempSrc: Oral  PainSc: 0-No pain      Patients Stated Pain Goal: 7 (03/50/09 3818)  Complications: No apparent anesthesia complications

## 2017-11-17 NOTE — H&P (Signed)
Primary Care Physician:  Earney Mallet, MD Primary Gastroenterologist:  Dr. [name]  Pre-Procedure History & Physical: HPI:  Felicia Hogan is a 72 y.o. female here for [place].  Past Medical History:  Diagnosis Date  . Anxiety   . Arthritis   . Depression   . Diabetes mellitus without complication ([place])   . Dyslipidemia   . History of kidney stones   . Hypertension   . Iron deficiency anemia due to chronic blood loss   . Mucous membrane pemphigoid 2014   so far only infects mouth. treated with oral steroids for flares. Lidex as well.   . Sleep apnea     Past Surgical History:  Procedure Laterality Date  . CESAREAN SECTION     twice  . CHOLECYSTECTOMY    . COLONOSCOPY  10/2012   Dr. West Carbo: normal. due 10/2017.   Marland Kitchen LITHOTRIPSY  2017  . REPLACEMENT TOTAL KNEE BILATERAL    . TONSILLECTOMY    . TOTAL HIP ARTHROPLASTY Right     Prior to Admission medications   Medication Sig Start Date End Date Taking? Authorizing Provider  acetaminophen (TYLENOL) 500 MG tablet Take 1,000 mg by mouth 3 (three) times daily as needed for moderate pain or headache.   Yes [provider]  amLODipine (NORVASC) 5 MG tablet Take 5 mg by mouth daily with supper.  07/27/09  Yes [provider]  atorvastatin (LIPITOR) 20 MG tablet Take 10 mg by mouth daily with supper.   Yes [provider]  cetirizine (ZYRTEC) 10 MG tablet Take 10 mg by mouth at bedtime as needed for allergies.   Yes [provider]  Cholecalciferol (VITAMIN D3) 5000 units TABS Take 5,000 Units by mouth daily with supper.    Yes [provider]  desvenlafaxine (PRISTIQ) 50 MG 24 hr tablet Take 50 mg by mouth daily.   Yes [provider]  fenofibrate 160 MG tablet Take 160 mg by mouth daily with lunch.  08/30/15  Yes [provider]  fluocinonide gel (LIDEX) 9.79 % 1 application See admin instructions. Apply orally (dont swallow) 3 times daily as needed for  mouth sores 12/31/09  Yes [provider]  ibuprofen (ADVIL,MOTRIN) 200 MG tablet Take 400 mg by mouth 3 (three) times daily as needed for headache or moderate pain.   Yes [provider]  metFORMIN (GLUCOPHAGE) 1000 MG tablet Take 500-1,000 mg by mouth See admin instructions. Take 1000 mg with breakfast, 500 mg with lunch, and 1000 mg with dinner 05/22/15  Yes [provider]  Multiple Vitamin (MULTIVITAMIN) tablet Take 1 tablet by mouth daily with lunch.    Yes [provider]  Multiple Vitamins-Minerals (PRESERVISION AREDS PO) Take 1 capsule by mouth 2 (two) times daily.    Yes [provider]  Na Sulfate-K Sulfate-Mg Sulf (SUPREP BOWEL PREP KIT) 17.5-3.13-1.6 GM/177ML SOLN Take 1 kit by mouth as directed. 10/19/17  Yes ,  L, MD  olmesartan-hydrochlorothiazide (BENICAR HCT) 20-12.5 MG tablet Take 1 tablet by mouth daily.   Yes [provider]  Omega-3 Fatty Acids (FISH OIL) 1200 MG CAPS Take 1,200 mg by mouth 3 (three) times daily.    Yes [provider]  Polyethyl Glycol-Propyl Glycol (SYSTANE ULTRA OP) Place 1 drop into both eyes 4 (four) times daily as needed (dry eyes).   Yes [provider]  ranitidine (ZANTAC) 150 MG tablet Take 150 mg by mouth at bedtime as needed for heartburn.    Yes [provider]  tamsulosin (FLOMAX) 0.4 MG CAPS capsule Take 0.4 mg by mouth daily as needed (kidney stone).  03/19/16  Yes [provider]  dexamethasone (DECADRON) 0.5 MG/5ML solution Take 5 mLs by mouth 2 (two) times daily as needed (oral sores).  10/12/15   [provider]    Allergies as of 10/19/2017 - Review Complete 10/19/2017  Allergen Reaction Noted  . Nsaids Itching 10/19/2017  . Erythromycin Hives and Rash 10/19/2017  . Sulfa antibiotics Rash 10/19/2017  . Tape Itching and Rash 10/19/2017    Family History  Problem Relation Age of Onset  . Liver cancer Mother        ?started in  pancreas  . Parkinson's disease Father   . Pneumonia Father   . Colon cancer Father 58       metastatic but did well  . Thyroid cancer Father   . Other Daughter 10       brain tumor, followed with serial MRIs. now age 83  . Colon cancer Paternal Aunt 90    Social History   Socioeconomic History  . Marital status: Married    Spouse name: Not on file  . Number of children: Not on file  . Years of education: Not on file  . Highest education level: Not on file  Occupational History  . Not on file  Social Needs  . Financial resource strain: Not on file  . Food insecurity:    Worry: Not on file    Inability: Not on file  . Transportation needs:    Medical: Not on file    Non-medical: Not on file  Tobacco Use  . Smoking status: Former Smoker    Packs/day: 0.25    Years: 1.00    Pack years: 0.25    Types: Cigarettes    Last attempt to quit: 11/14/1967    Years since quitting: 50.0  . Smokeless tobacco: Never Used  Substance and Sexual Activity  . Alcohol use: Yes    Comment: rare  . Drug use: Never  . Sexual activity: Yes    Birth control/protection: Post-menopausal  Lifestyle  . Physical activity:    Days per week: Not on file    Minutes per session: Not on file  . Stress: Not on file  Relationships  . Social connections:    Talks on phone: Not on file    Gets together: Not on file    Attends religious service: Not on file    Active member of club or organization: Not on file    Attends meetings of clubs or organizations: Not on file    Relationship status: Not on file  . Intimate partner violence:    Fear of current or ex partner: Not on file    Emotionally abused: Not on file    Physically abused: Not on file    Forced sexual activity: Not on file  Other Topics Concern  . Not on file  Social History Narrative  . Not on file    Review of Systems: See HPI, otherwise negative ROS   Physical Exam: BP 126/67   Pulse 90   Temp 98.4 F (36.9 C) (Oral)    Resp 20   SpO2 94%  General:   Alert,  pleasant and cooperative in NAD Head:  Normocephalic and atraumatic. Neck:  Supple; Lungs:  Clear throughout to auscultation.    Heart:  Regular rate and rhythm. Abdomen:  Soft, nontender and nondistended. Normal bowel sounds, without guarding, and without  rebound.   Neurologic:  Alert and  oriented x4;  grossly normal neurologically.  Impression/Plan:    IRON DEFICIENCY ANEMIA.  PLAN:  1. TCS/EGD TODAY. DISCUSSED PROCEDURE, BENEFITS, & RISKS: < 1% chance of medication reaction, bleeding, perforation, or rupture of spleen/liver.

## 2017-11-17 NOTE — Anesthesia Postprocedure Evaluation (Signed)
Anesthesia Post Note  Patient: Charliene Inoue  Procedure(s) Performed: COLONOSCOPY WITH PROPOFOL (N/A ) ESOPHAGOGASTRODUODENOSCOPY (EGD) WITH PROPOFOL (N/A ) POLYPECTOMY BIOPSY  Patient location during evaluation: PACU Anesthesia Type: General Level of consciousness: awake and alert and oriented Pain management: pain level controlled Vital Signs Assessment: post-procedure vital signs reviewed and stable Respiratory status: spontaneous breathing Cardiovascular status: blood pressure returned to baseline and stable Postop Assessment: no apparent nausea or vomiting Anesthetic complications: no     Last Vitals:  Vitals:   11/17/17 1005  BP: 126/67  Pulse: 90  Resp: 20  Temp: 36.9 C  SpO2: 94%    Last Pain:  Vitals:   11/17/17 1005  TempSrc: Oral  PainSc: 0-No pain                 ,

## 2017-11-17 NOTE — Anesthesia Preprocedure Evaluation (Signed)
Anesthesia Evaluation  Patient identified by MRN, date of birth, ID band Patient awake    Reviewed: Allergy & Precautions, NPO status , Patient's Chart, lab work & pertinent test results  Airway Mallampati: II  TM Distance: >3 FB Neck ROM: Full    Dental no notable dental hx. (+) Teeth Intact   Pulmonary neg pulmonary ROS, sleep apnea and Continuous Positive Airway Pressure Ventilation , former smoker,    Pulmonary exam normal breath sounds clear to auscultation       Cardiovascular Exercise Tolerance: Good hypertension, Pt. on medications negative cardio ROS Normal cardiovascular examI Rhythm:Regular Rate:Normal     Neuro/Psych Anxiety Depression negative neurological ROS  negative psych ROS   GI/Hepatic negative GI ROS, Neg liver ROS, GERD  Medicated and Controlled,  Endo/Other  negative endocrine ROSdiabetes, Well Controlled, Type 2  Renal/GU negative Renal ROS  negative genitourinary   Musculoskeletal negative musculoskeletal ROS (+) Arthritis , Osteoarthritis,    Abdominal   Peds negative pediatric ROS (+)  Hematology negative hematology ROS (+) anemia ,   Anesthesia Other Findings   Reproductive/Obstetrics negative OB ROS                             Anesthesia Physical Anesthesia Plan  ASA: III  Anesthesia Plan: General   Post-op Pain Management:    Induction: Intravenous  PONV Risk Score and Plan:   Airway Management Planned: Nasal Cannula and Simple Face Mask  Additional Equipment:   Intra-op Plan:   Post-operative Plan:   Informed Consent: I have reviewed the patients History and Physical, chart, labs and discussed the procedure including the risks, benefits and alternatives for the proposed anesthesia with the patient or authorized representative who has indicated his/her understanding and acceptance.   Dental advisory given  Plan Discussed with:  CRNA  Anesthesia Plan Comments:         Anesthesia Quick Evaluation

## 2017-11-17 NOTE — Op Note (Signed)
[facility] Patient Name: Felicia Hogan Procedure Date: 11/17/2017 11:13 AM MRN: [id number] Date of Birth: [birth year] Attending MD: Barney Drain MD, MD CSN: [id number] Age: 72 Admit Type: Outpatient Procedure:                Colonoscopy WITH COLD SNARE POLYPECTOMY Indications:              Iron deficiency anemia Providers:                Barney Drain MD, MD, Lurline Del, RN, Aram Candela Referring MD:             Earney Mallet Medicines:                Propofol per Anesthesia Complications:            No immediate complications. Estimated Blood Loss:     Estimated blood loss was minimal. Procedure:                Pre-Anesthesia Assessment:                           - Prior to the procedure, a History and Physical                            was performed, and patient medications and                            allergies were reviewed. The patient's tolerance of                            previous anesthesia was also reviewed. The risks                            and benefits of the procedure and the sedation                            options and risks were discussed with the patient.                            All questions were answered, and informed consent                            was obtained. Prior Anticoagulants: The patient has                            taken ibuprofen. ASA Grade Assessment: II - A                            patient with mild systemic disease. After reviewing                            the risks and benefits, the patient was deemed in                            satisfactory condition to undergo the procedure.  After obtaining informed consent, the colonoscope                            was passed under direct vision. Throughout the                            procedure, the patient's blood pressure, pulse, and                            oxygen saturations were monitored continuously. The                            [id number]  ([id number]) scope was introduced through                            the anus and advanced to the 5 cm into the ileum.                            The colonoscopy was somewhat difficult due to a                            tortuous colon. Successful completion of the                            procedure was aided by using manual pressure,                            straightening and shortening the scope to obtain                            bowel loop reduction and COLOWRAP. The patient                            tolerated the procedure well. The quality of the                            bowel preparation was excellent. The terminal                            ileum, ileocecal valve, appendiceal orifice, and                            rectum were photographed. Scope In: 11:39:12 AM Scope Out: 11:57:40 AM Scope Withdrawal Time: 0 hours 8 minutes 32 seconds  Total Procedure Duration: 0 hours 18 minutes 28 seconds  Findings:      The terminal ileum appeared normal.      A 6 mm polyp was found in the splenic flexure. The polyp was sessile.       The polyp was removed with a cold snare. Resection and retrieval were       complete.      The recto-sigmoid colon, sigmoid colon and descending colon revealed       significantly excessive looping.      Internal hemorrhoids were found. The hemorrhoids were small. Impression:               -  NO SOURCE FOR IRON DEFICIENCY ANEMIA IDENTIFIED.                           - One 6 mm polyp at the splenic flexure, removed                            with a cold snare. Resected and retrieved.                           - There was significant looping of the colon.                           - Internal hemorrhoids. Moderate Sedation:      Per Anesthesia Care Recommendation:           - Patient has a contact number available for                            emergencies. The signs and symptoms of potential                            delayed complications were discussed  with the                            patient. Return to normal activities tomorrow.                            Written discharge instructions were provided to the                            patient.                           - High fiber diet and low fat diet.                           - Continue present medications.                           - Await pathology results. PROCEED TO EGD.                           - Repeat colonoscopy is not recommended due to                            current age (70 years or older) for surveillance.                           - Return to GI office in 4 months. Procedure Code(s):        --- Professional ---                           [phone], Colonoscopy, flexible; with removal of  tumor(s), polyp(s), or other lesion(s) by snare                            technique Diagnosis Code(s):        --- Professional ---                           K64.8, Other hemorrhoids                           D12.3, Benign neoplasm of transverse colon (hepatic                            flexure or splenic flexure)                           D50.9, Iron deficiency anemia, unspecified CPT copyright 2017 American Medical Association. All rights reserved. The codes documented in this report are preliminary and upon coder review may  be revised to meet current compliance requirements. Barney Drain, MD Barney Drain MD, MD 11/17/2017 12:31:50 PM This report has been signed electronically. Number of Addenda: 0

## 2017-11-17 NOTE — Addendum Note (Signed)
Addendum  created 11/17/17 1304 by Ollen Bowl, CRNA   Charge Capture section accepted

## 2017-11-18 ENCOUNTER — Encounter (HOSPITAL_COMMUNITY): Payer: Self-pay | Admitting: Gastroenterology

## 2017-11-18 NOTE — Discharge Instructions (Signed)
NO OBVIOUS SOURCE FOR YOUR ANEMIA WAS IDENTIFIED. YOUR UPPER ABDOMINAL PAIN IS FROM gastritis DUE TO IBUPROFEN.  You had 1 small polyp removed. You have SMALL internal hemorrhoids. You have ESOPHAGITIS FROM REFLUX, & a SMALL HIATAL HERNIA. I biopsied your stomach, AND SMALL BOWEL.THE LAST PART OF YOUR SMALL BOWEL IS NORMAL.    DRINK WATER TO KEEP YOUR URINE LIGHT YELLOW.  AVOID ITEMS THAT TRIGGER REFLUX. SEE INFO BELOW.  FOLLOW A HIGH FIBER/LOW FAT DIET. AVOID ITEMS THAT CAUSE BLOATING.MEATS SHOULD BE BAKED, BROILED, OR BOILED. AVOID FRIED FOODS. SEE INFO BELOW ON A LOW FAT DIET.   TO PREVENT ULCERS AND GASTRITIS DUE TO DAILY IBUPROFEN USE,  START OMEPRAZOLE.  TAKE 30 MINUTES PRIOR TO YOUR FIRST MEAL.   YOUR BIOPSY RESULTS WILL BE BACK IN 7 DAYS.  FOLLOW UP IN 4 MOS.   We do not routinely screen for polyps after the age of 24.    ENDOSCOPY Care After Read the instructions outlined below and refer to this sheet in the next week. These discharge instructions provide you with general information on caring for yourself after you leave the hospital. While your treatment has been planned according to the most current medical practices available, unavoidable complications occasionally occur. If you have any problems or questions after discharge, call DR. , [phone].  ACTIVITY  You may resume your regular activity, but move at a slower pace for the next 24 hours.   Take frequent rest periods for the next 24 hours.   Walking will help get rid of the air and reduce the bloated feeling in your belly (abdomen).   No driving for 24 hours (because of the medicine (anesthesia) used during the test).   You may shower.   Do not sign any important legal documents or operate any machinery for 24 hours (because of the anesthesia used during the test).    NUTRITION  Drink plenty of fluids.   You may resume your normal diet as instructed by your doctor.   Begin with a light meal  and progress to your normal diet. Heavy or fried foods are harder to digest and may make you feel sick to your stomach (nauseated).   Avoid alcoholic beverages for 24 hours or as instructed.    MEDICATIONS  You may resume your normal medications.   WHAT YOU CAN EXPECT TODAY  Some feelings of bloating in the abdomen.   Passage of more gas than usual.   Spotting of blood in your stool or on the toilet paper  .  IF YOU HAD POLYPS REMOVED DURING THE ENDOSCOPY:  Eat a soft diet IF YOU HAVE NAUSEA, BLOATING, ABDOMINAL PAIN, OR VOMITING.    FINDING OUT THE RESULTS OF YOUR TEST Not all test results are available during your visit. DR. [name] WILL CALL YOU WITHIN 14 DAYS OF YOUR PROCEDUE WITH YOUR RESULTS. Do not assume everything is normal if you have not heard from DR. , CALL HER OFFICE AT [phone].  SEEK IMMEDIATE MEDICAL ATTENTION AND CALL THE OFFICE: [phone] IF:  You have more than a spotting of blood in your stool.   Your belly is swollen (abdominal distention).   You are nauseated or vomiting.   You have a temperature over 101F.   You have abdominal pain or discomfort that is severe or gets worse throughout the day.    Lifestyle and home remedies to control REFLUX/HEARTBURN  You may eliminate or reduce the frequency of heartburn by making the following lifestyle changes:  Control your weight. Being overweight is a major risk factor for heartburn and GERD. Excess pounds put pressure on your abdomen, pushing up your stomach and causing acid to back up into your esophagus.    Eat smaller meals. 4 TO 6 MEALS A DAY. This reduces pressure on the lower esophageal sphincter, helping to prevent the valve from opening and acid from washing back into your esophagus.    Loosen your belt. Clothes that fit tightly around your waist put pressure on your abdomen and the lower esophageal sphincter.    Eliminate heartburn triggers. Everyone has specific triggers.  Common triggers such as fatty or fried foods, spicy food, tomato sauce, carbonated beverages, alcohol, chocolate, mint, garlic, onion, caffeine and nicotine may make heartburn worse.    Avoid stooping or bending. Tying your shoes is OK. Bending over for longer periods to weed your garden isn't, especially soon after eating.    Don't lie down after a meal. Wait at least three to four hours after eating before going to bed, and don't lie down right after eating.    Alternative medicine  Several home remedies exist for treating GERD, but they provide only temporary relief. They include drinking baking soda (sodium bicarbonate) added to water or drinking other fluids such as baking soda mixed with cream of tartar and water.   Although these liquids create temporary relief by neutralizing, washing away or buffering acids, eventually they aggravate the situation by adding gas and fluid to your stomach, increasing pressure and causing more acid reflux. Further, adding more sodium to your diet may increase your blood pressure and add stress to your heart, and excessive bicarbonate ingestion can alter the acid-base balance in your body.    Low-Fat Diet BREADS, CEREALS, PASTA, RICE, DRIED PEAS, AND BEANS These products are high in carbohydrates and most are low in fat. Therefore, they can be increased in the diet as substitutes for fatty foods. They too, however, contain calories and should not be eaten in excess. Cereals can be eaten for snacks as well as for breakfast.   FRUITS AND VEGETABLES It is good to eat fruits and vegetables. Besides being sources of fiber, both are rich in vitamins and some minerals. They help you get the daily allowances of these nutrients. Fruits and vegetables can be used for snacks and desserts.  MEATS Limit lean meat, chicken, [place], and fish to no more than 6 ounces per day. Beef, Pork, and Lamb Use lean cuts of beef, pork, and lamb. Lean cuts include:  Extra-lean  ground beef.  Arm roast.  Sirloin tip.  Center-cut ham.  Round steak.  Loin chops.  Rump roast.  Tenderloin.  Trim all fat off the outside of meats before cooking. It is not necessary to severely decrease the intake of red meat, but lean choices should be made. Lean meat is rich in protein and contains a highly absorbable form of iron. Premenopausal women, in particular, should avoid reducing lean red meat because this could increase the risk for low red blood cells (iron-deficiency anemia).  Chicken and [place] These are good sources of protein. The fat of poultry can be reduced by removing the skin and underlying fat layers before cooking. Chicken and [place] can be substituted for lean red meat in the diet. Poultry should not be fried or covered with high-fat sauces. Fish and Shellfish Fish is a good source of protein. Shellfish contain cholesterol, but they usually are low in saturated fatty acids. The preparation of fish is  important. Like chicken and [place], they should not be fried or covered with high-fat sauces. EGGS Egg whites contain no fat or cholesterol. They can be eaten often. Try 1 to 2 egg whites instead of whole eggs in recipes or use egg substitutes that do not contain yolk. MILK AND DAIRY PRODUCTS Use skim or 1% milk instead of 2% or whole milk. Decrease whole milk, natural, and processed cheeses. Use nonfat or low-fat (2%) cottage cheese or low-fat cheeses made from vegetable oils. Choose nonfat or low-fat (1 to 2%) yogurt. Experiment with evaporated skim milk in recipes that call for heavy cream. Substitute low-fat yogurt or low-fat cottage cheese for sour cream in dips and salad dressings. Have at least 2 servings of low-fat dairy products, such as 2 glasses of skim (or 1%) milk each day to help get your daily calcium intake. FATS AND OILS Reduce the total intake of fats, especially saturated fat. Butterfat, lard, and beef fats are high in saturated fat and cholesterol. These  should be avoided as much as possible. Vegetable fats do not contain cholesterol, but certain vegetable fats, such as coconut oil, palm oil, and palm kernel oil are very high in saturated fats. These should be limited. These fats are often used in bakery goods, processed foods, popcorn, oils, and nondairy creamers. Vegetable shortenings and some peanut butters contain hydrogenated oils, which are also saturated fats. Read the labels on these foods and check for saturated vegetable oils. Unsaturated vegetable oils and fats do not raise blood cholesterol. However, they should be limited because they are fats and are high in calories. Total fat should still be limited to 30% of your daily caloric intake. Desirable liquid vegetable oils are corn oil, cottonseed oil, olive oil, canola oil, safflower oil, soybean oil, and sunflower oil. Peanut oil is not as good, but small amounts are acceptable. Buy a heart-healthy tub margarine that has no partially hydrogenated oils in the ingredients. Mayonnaise and salad dressings often are made from unsaturated fats, but they should also be limited because of their high calorie and fat content. Seeds, nuts, peanut butter, olives, and avocados are high in fat, but the fat is mainly the unsaturated type. These foods should be limited mainly to avoid excess calories and fat. OTHER EATING TIPS Snacks  Most sweets should be limited as snacks. They tend to be rich in calories and fats, and their caloric content outweighs their nutritional value. Some good choices in snacks are graham crackers, melba toast, soda crackers, bagels (no egg), English muffins, fruits, and vegetables. These snacks are preferable to snack crackers, [place] fries, TORTILLA CHIPS, and POTATO chips. Popcorn should be air-popped or cooked in small amounts of liquid vegetable oil. Desserts Eat fruit, low-fat yogurt, and fruit ices instead of pastries, cake, and cookies. Sherbet, angel food cake, gelatin dessert,  frozen low-fat yogurt, or other frozen products that do not contain saturated fat (pure fruit juice bars, frozen ice pops) are also acceptable.  COOKING METHODS Choose those methods that use little or no fat. They include: Poaching.  Braising.  Steaming.  Grilling.  Baking.  Stir-frying.  Broiling.  Microwaving.  Foods can be cooked in a nonstick pan without added fat, or use a nonfat cooking spray in regular cookware. Limit fried foods and avoid frying in saturated fat. Add moisture to lean meats by using water, broth, cooking wines, and other nonfat or low-fat sauces along with the cooking methods mentioned above. Soups and stews should be chilled after cooking. The  fat that forms on top after a few hours in the refrigerator should be skimmed off. When preparing meals, avoid using excess salt. Salt can contribute to raising blood pressure in some people.  EATING AWAY FROM HOME Order entres, potatoes, and vegetables without sauces or butter. When meat exceeds the size of a deck of cards (3 to 4 ounces), the rest can be taken home for another meal. Choose vegetable or fruit salads and ask for low-calorie salad dressings to be served on the side. Use dressings sparingly. Limit high-fat toppings, such as bacon, crumbled eggs, cheese, sunflower seeds, and olives. Ask for heart-healthy tub margarine instead of butter.     High-Fiber Diet A high-fiber diet changes your normal diet to include more whole grains, legumes, fruits, and vegetables. Changes in the diet involve replacing refined carbohydrates with unrefined foods. The calorie level of the diet is essentially unchanged. The Dietary Reference Intake (recommended amount) for adult males is 38 grams per day. For adult females, it is 25 grams per day. Pregnant and lactating women should consume 28 grams of fiber per day. Fiber is the intact part of a plant that is not broken down during digestion. Functional fiber is fiber that has been  isolated from the plant to provide a beneficial effect in the body.  PURPOSE  Increase stool bulk.   Ease and regulate bowel movements.   Lower cholesterol.   REDUCE RISK OF COLON CANCER  INDICATIONS THAT YOU NEED MORE FIBER  Constipation and hemorrhoids.   Uncomplicated diverticulosis (intestine condition) and irritable bowel syndrome.   Weight management.   As a protective measure against hardening of the arteries (atherosclerosis), diabetes, and cancer.   GUIDELINES FOR INCREASING FIBER IN THE DIET  Start adding fiber to the diet slowly. A gradual increase of about 5 more grams (2 slices of whole-wheat bread, 2 servings of most fruits or vegetables, or 1 bowl of high-fiber cereal) per day is best. Too rapid an increase in fiber may result in constipation, flatulence, and bloating.   Drink enough water and fluids to keep your urine clear or pale yellow. Water, juice, or caffeine-free drinks are recommended. Not drinking enough fluid may cause constipation.   Eat a variety of high-fiber foods rather than one type of fiber.   Try to increase your intake of fiber through using high-fiber foods rather than fiber pills or supplements that contain small amounts of fiber.   The goal is to change the types of food eaten. Do not supplement your present diet with high-fiber foods, but replace foods in your present diet.   INCLUDE A VARIETY OF FIBER SOURCES  Replace refined and processed grains with whole grains, canned fruits with fresh fruits, and incorporate other fiber sources. White rice, white breads, and most bakery goods contain little or no fiber.   Brown whole-grain rice, buckwheat oats, and many fruits and vegetables are all good sources of fiber. These include: broccoli, Brussels sprouts, cabbage, cauliflower, beets, sweet potatoes, white potatoes (skin on), carrots, tomatoes, eggplant, squash, berries, fresh fruits, and dried fruits.   Cereals appear to be the richest  source of fiber. Cereal fiber is found in whole grains and bran. Bran is the fiber-rich outer coat of cereal grain, which is largely removed in refining. In whole-grain cereals, the bran remains. In breakfast cereals, the largest amount of fiber is found in those with "bran" in their names. The fiber content is sometimes indicated on the label.   You may need to include  additional fruits and vegetables each day.   In baking, for 1 cup white flour, you may use the following substitutions:   1 cup whole-wheat flour minus 2 tablespoons.   1/2 cup white flour plus 1/2 cup whole-wheat flour.

## 2017-11-19 ENCOUNTER — Encounter (HOSPITAL_COMMUNITY): Payer: Self-pay | Admitting: Gastroenterology

## 2017-11-19 ENCOUNTER — Other Ambulatory Visit: Payer: Self-pay | Admitting: *Deleted

## 2017-11-19 ENCOUNTER — Encounter: Payer: Self-pay | Admitting: *Deleted

## 2017-11-19 DIAGNOSIS — K922 Gastrointestinal hemorrhage, unspecified: Secondary | ICD-10-CM

## 2017-11-19 DIAGNOSIS — D509 Iron deficiency anemia, unspecified: Secondary | ICD-10-CM

## 2017-11-19 NOTE — Progress Notes (Signed)
PT is aware and Ok to schedule the [facility].

## 2017-11-19 NOTE — Progress Notes (Signed)
Patient scheduled.

## 2017-11-20 ENCOUNTER — Encounter: Payer: Self-pay | Admitting: Gastroenterology

## 2017-11-30 ENCOUNTER — Ambulatory Visit (HOSPITAL_COMMUNITY)
Admission: RE | Admit: 2017-11-30 | Discharge: 2017-11-30 | Disposition: A | Discharge status: Home / Self Care | Payer: Medicare Other | Source: Ambulatory Visit | Attending: Gastroenterology | Admitting: Gastroenterology

## 2017-11-30 ENCOUNTER — Encounter (HOSPITAL_COMMUNITY)
Admission: RE | Disposition: A | Discharge status: Home / Self Care | Payer: Self-pay | Source: Ambulatory Visit | Attending: Gastroenterology

## 2017-11-30 DIAGNOSIS — D509 Iron deficiency anemia, unspecified: Secondary | ICD-10-CM | POA: Diagnosis not present

## 2017-11-30 DIAGNOSIS — K922 Gastrointestinal hemorrhage, unspecified: Secondary | ICD-10-CM | POA: Insufficient documentation

## 2017-11-30 DIAGNOSIS — D500 Iron deficiency anemia secondary to blood loss (chronic): Secondary | ICD-10-CM | POA: Diagnosis not present

## 2017-11-30 HISTORY — PX: GIVENS CAPSULE STUDY: SHX5432

## 2017-11-30 SURGERY — IMAGING PROCEDURE, GI TRACT, INTRALUMINAL, VIA CAPSULE

## 2017-12-07 ENCOUNTER — Encounter (HOSPITAL_COMMUNITY): Payer: Self-pay | Admitting: Gastroenterology

## 2017-12-09 ENCOUNTER — Telehealth: Payer: Self-pay | Admitting: *Deleted

## 2017-12-09 NOTE — Telephone Encounter (Signed)
PLEASE CALL PT. HER GIVES RESULTS WILL BE AVAILABLE SEP 19 AFTER 12N.

## 2017-12-09 NOTE — Telephone Encounter (Signed)
Patient aware.

## 2017-12-09 NOTE — Telephone Encounter (Signed)
Patient called requesting her GIVENS capsule study results. Please advise Dr. [name]. thanks

## 2017-12-10 ENCOUNTER — Telehealth: Payer: Self-pay

## 2017-12-10 ENCOUNTER — Other Ambulatory Visit: Payer: Self-pay

## 2017-12-10 DIAGNOSIS — D509 Iron deficiency anemia, unspecified: Secondary | ICD-10-CM

## 2017-12-10 NOTE — Procedures (Signed)
INDICATION: OBSCURE GI BLEED/FEDA(AUG 2019 Hb 10.3, FERRITIN 8)  PATIENT DATA: GASTRIC PASSAGE TIME: 1H 44m, SB PASSAGE TIME: 4H 84m  RESULTS: LIMITED views of gastric mucosa due to retained contents.  OCCASIONAL EROSION IN SMALL BOWEL. No ULCERS, OR masses seen IN SMALL BOWEL.  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 LIMITED VIEWS OF THE COLON DUE TO RETAINED CONTENTS. No old blood or fresh blood in the stomach, small bowel, or colon.  DIAGNOSIS: COLON AVM/NSAID ENTEROPATHY  Plan: 1. AVOID ASPIRIN, BC/GOODY POWDERS, IBUPROFEN/MOTRIN, OR NAPROXEN/ALEVE FOR 3 MOS. 2. EAT FOODS THAT ARE HIGH IN IRON. HANDOUT GIVEN. 3. HAVE CBC/FERRITIN CHECKED IN DEC 2019. 4. 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

## 2017-12-10 NOTE — Telephone Encounter (Addendum)
PLEASE CALL PT. SHE HAS GASTRITIS AND OCCASIONAL EROSION IN HER SMALL BOWEL LIKELY DUE TO IBUPROFEN  AND ONE COLONIC Arteriovenous Malformation(AVMs), A SUPERFICIAL COLLECTION OF BLOOD VESSELS ON THE SURFACE OF THE COLON THAT CAN CAUSE HER TO LOSE BLOOD BUT NOT SEE ANYTHING COME OUT.  An AVM may occur in the STOMACH, COLON, OR SMALL BOWEL. IBUPROFEN CAN MAKE THESE AREAS MORE LIKELY TO BLEED.  HER LAST BLOOD COUNT IN AUG 2019 WAS LOW AT 10.3 AND HER IRON STORES WERE LOW. THIS IS MOST LIKELY DUE TO IBUPROFEN.   SHE SHOULD: 1. AVOID ASPIRIN, BC/GOODY POWDERS, IBUPROFEN/MOTRIN, OR NAPROXEN/ALEVE FOR 3 MOS. 2. EAT FOODS THAT ARE HIGH IN IRON. WE CAN SEND HER A HANDOUT. 3. HAVE CBC/FERRITIN CHECKED IN DEC 2019. 4. 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   High Iron Diet  Purpose Iron is a mineral essential for life. Found in red blood cells, iron's primary role is to carry oxygen from the lungs to the rest of the body. Without oxygen, the body's cells cannot function normally. If the body's iron stores become too low, an iron-deficiency anemia can occur. This is characterized by weakness, lethargy, muscle fatigue, and shortness of breath. In severe cases, a person's skin may become pale due to a lack of red blood cells in the body. In adults, iron deficiency is most commonly caused by chronic blood loss, such as with heavy menstruation or intestinal bleeding from peptic ulcers, cancer, or hemorrhoids. In children, iron deficiency is usually the result of an inadequate iron intake.  Nutrition Facts The recommended dietary allowance (RDA) for iron in healthy adults is 10 milligrams per day for men and 15 milligrams per day for premenopausal women. Premenopausal women's needs are higher than men's needs because women lose iron during menstruation. It is generally easier for men to get enough iron than it is for women. Because they are usually bigger, men have higher calorie  needs and will most likely eat enough food to meet their iron requirements. Women, on the other hand, tend to eat less. This makes it more difficult for them to meet their iron needs. It is, therefore, particularly important for premenopausal women to eat foods high in iron. Pregnant women will need as much as 30 milligrams of iron per day. The main reason is because the unborn baby needs iron for development. As a result, it will draw from the mother's iron stores. This can quickly deplete a woman of iron if she is not eating enough iron rich foods. The following table lists foods high in iron. In general, meat, fish, and poultry are excellent sources. Other sources of iron include beans, dried fruits, whole grains, fortified cereals, and enriched breads.   Special Considerations  1. Heme and nonhemd iron are two forms of iron in foods. Heme iron is found in meats, poultry, and fish. NonHeme iron is found in both plant and animal foods. Heme iron is more easily absorbed by the body than nonheme iron. However, heme iron can also promote the absorption of non-heme iron. Therefore, eating beef and beans, for example, is good for providing adequate absorption of both types of iron. 2. Vitamin C also promotes iron absorption. This is true for both heme and nonheme iron. It is, therefore, beneficial to consume citrus fruits or juices, which are high in vitamin C, with foods that contain iron. For example, a meal might include a lean sirloin steak (heme iron source), baked potato (nonheme iron  source , broccoli (nonheme iroj source), and an orange (vitamin C source) for a good iron intake. 3. Phytic and tannic aids are two food components that, when consumed in large amounts, prevent the abrorption of iron. Phytic acid is found in rye bread and other foods made from whole grains. Phytic acid is also found in nonherbal teas. Tannic acid is found in commercial black and pekoe teas, coffee, cola drinks, chocolate,  and red wines. 4. Iron SupplementsThere are many different kinds of iron supplements. However, iron supplemants should only be taken when there is a true deficiency of iron and only under medical supervision. General multivitamins often have iron and other minerals added to them in moderate amounts. If otherwise healthy, this amount of iron is probably not harmful. If iron is to be avoided, multivitamins containing iron should not be used.Please note that it is important to keep iron and multivita-in supplements safely away from a child's reach. If ingested, severe poisoning can occur.   Foods That Contain Iron  Food Serving Size (mg)  Bran flakes cereal 1 cup 24.0  Product 19 cereal 1 cup 24.0  Clams, steamed 3 oz 23.8  Total cereal 1 cup 18.0  Life cereal 1 cup 12.2  Raisin bran cereal 1 cup 9.3  Beef liver, braised 3 oz 5.8  Kix cereal 1 cup 5.4  Cheerios cereal 1 cup 3.6  Prune juice 1 cup 3.0  Potato, baked with skin 1 med 2.8  Sirloin steak, cooked 3 oz 2.8  Shrimp, cooked 3 oz 2.6  Navy beans, cooked 1/2 cup 2.3  Figs, dried 5 2.1  Lean ground beef, broiled 3 oz 2.1  Swiss chard, cooked 1/2 cup 2.0  Rice krispies cereal 1 cup 1.8  Kidney beans 1/2 cup 1.6  Oatmeal, cooked 1/2 cup 1.6  Spinach, raw 1 cup 1.5  Tuna, canned in water 3 oz 1.3  Green peas, conked 1/2 cup 1.2  Halibut, cooked 3 oz 0.9  Whole-wheat bread 1 slice 0.9  Apricot halves, dried 5 0.8  Raisins 1/4 cup 0.8  Broccoli, cooked 1/2 cup 0.6  Egg, boiled 1 large 0.6    .

## 2017-12-10 NOTE — Telephone Encounter (Signed)
Pt said her appt has been rescheduled several times. She is aware it is currently with Randall Hiss and she would like to have it rescheduled to be with Magda Paganini. She has seen Magda Paganini before and is comfortable with her.  Forwarding to [place] to reschedule.

## 2017-12-10 NOTE — Telephone Encounter (Signed)
REVIEWED-NO ADDITIONAL RECOMMENDATIONS. 

## 2017-12-10 NOTE — Telephone Encounter (Signed)
PT is aware of results and plan. However, she said she does not take NSAIDS. She uses ES Tylenol if she needs it.  She is aware we will do the Blood work in Dec.  She was scheduled OV with EG and separate note to [place] to change to Neil Crouch, PA.  She has seen Magda Paganini before and is comfortable with her.  Handout mailed on iron foods.

## 2017-12-14 NOTE — Telephone Encounter (Signed)
PATIENT ON RECALL  °

## 2017-12-14 NOTE — Telephone Encounter (Signed)
On recall  °

## 2017-12-14 NOTE — Telephone Encounter (Signed)
CALLED PATIENT AND SHE IS ON A RECALL FOR APPT WITH [facility]

## 2017-12-22 ENCOUNTER — Other Ambulatory Visit (HOSPITAL_COMMUNITY): Payer: Medicare Other

## 2018-01-24 ENCOUNTER — Emergency Department (HOSPITAL_COMMUNITY): Payer: Medicare Other

## 2018-01-24 ENCOUNTER — Inpatient Hospital Stay (HOSPITAL_COMMUNITY)
Admission: EM | Admit: 2018-01-24 | Discharge: 2018-01-27 | DRG: 690 | Disposition: A | Discharge status: Home / Self Care | Payer: Medicare Other | Attending: Internal Medicine | Admitting: Internal Medicine

## 2018-01-24 ENCOUNTER — Encounter (HOSPITAL_COMMUNITY): Payer: Self-pay | Admitting: Emergency Medicine

## 2018-01-24 ENCOUNTER — Other Ambulatory Visit: Payer: Self-pay

## 2018-01-24 DIAGNOSIS — E876 Hypokalemia: Secondary | ICD-10-CM | POA: Diagnosis present

## 2018-01-24 DIAGNOSIS — E119 Type 2 diabetes mellitus without complications: Secondary | ICD-10-CM | POA: Diagnosis present

## 2018-01-24 DIAGNOSIS — Z1611 Resistance to penicillins: Secondary | ICD-10-CM | POA: Diagnosis present

## 2018-01-24 DIAGNOSIS — E785 Hyperlipidemia, unspecified: Secondary | ICD-10-CM | POA: Diagnosis present

## 2018-01-24 DIAGNOSIS — F418 Other specified anxiety disorders: Secondary | ICD-10-CM | POA: Diagnosis present

## 2018-01-24 DIAGNOSIS — J0110 Acute frontal sinusitis, unspecified: Secondary | ICD-10-CM | POA: Diagnosis present

## 2018-01-24 DIAGNOSIS — Z6841 Body Mass Index (BMI) 40.0 and over, adult: Secondary | ICD-10-CM

## 2018-01-24 DIAGNOSIS — Z79899 Other long term (current) drug therapy: Secondary | ICD-10-CM

## 2018-01-24 DIAGNOSIS — Z1629 Resistance to other single specified antibiotic: Secondary | ICD-10-CM | POA: Diagnosis present

## 2018-01-24 DIAGNOSIS — Z91048 Other nonmedicinal substance allergy status: Secondary | ICD-10-CM

## 2018-01-24 DIAGNOSIS — Z96641 Presence of right artificial hip joint: Secondary | ICD-10-CM | POA: Diagnosis present

## 2018-01-24 DIAGNOSIS — Z882 Allergy status to sulfonamides status: Secondary | ICD-10-CM | POA: Diagnosis not present

## 2018-01-24 DIAGNOSIS — Z881 Allergy status to other antibiotic agents status: Secondary | ICD-10-CM

## 2018-01-24 DIAGNOSIS — N12 Tubulo-interstitial nephritis, not specified as acute or chronic: Secondary | ICD-10-CM | POA: Diagnosis present

## 2018-01-24 DIAGNOSIS — Z886 Allergy status to analgesic agent status: Secondary | ICD-10-CM

## 2018-01-24 DIAGNOSIS — I10 Essential (primary) hypertension: Secondary | ICD-10-CM | POA: Diagnosis present

## 2018-01-24 DIAGNOSIS — Z96653 Presence of artificial knee joint, bilateral: Secondary | ICD-10-CM | POA: Diagnosis present

## 2018-01-24 DIAGNOSIS — N10 Acute tubulo-interstitial nephritis: Principal | ICD-10-CM

## 2018-01-24 DIAGNOSIS — Z7984 Long term (current) use of oral hypoglycemic drugs: Secondary | ICD-10-CM

## 2018-01-24 HISTORY — DX: Acute pyelonephritis: N10

## 2018-01-24 LAB — COMPREHENSIVE METABOLIC PANEL
ALK PHOS: 57 U/L (ref 38–126)
ALT: 35 U/L (ref 0–44)
AST: 31 U/L (ref 15–41)
Albumin: 3.1 g/dL — ABNORMAL LOW (ref 3.5–5.0)
Anion gap: 9 (ref 5–15)
BUN: 18 mg/dL (ref 8–23)
CALCIUM: 9.2 mg/dL (ref 8.9–10.3)
CHLORIDE: 100 mmol/L (ref 98–111)
CO2: 24 mmol/L (ref 22–32)
CREATININE: 0.91 mg/dL (ref 0.44–1.00)
Glucose, Bld: 232 mg/dL — ABNORMAL HIGH (ref 70–99)
Potassium: 3.3 mmol/L — ABNORMAL LOW (ref 3.5–5.1)
Sodium: 133 mmol/L — ABNORMAL LOW (ref 135–145)
Total Bilirubin: 1.1 mg/dL (ref 0.3–1.2)
Total Protein: 6.7 g/dL (ref 6.5–8.1)

## 2018-01-24 LAB — CBC WITH DIFFERENTIAL/PLATELET
Abs Immature Granulocytes: 0.13 10*3/uL — ABNORMAL HIGH (ref 0.00–0.07)
Basophils Absolute: 0.1 10*3/uL (ref 0.0–0.1)
Basophils Relative: 0 %
EOS ABS: 0 10*3/uL (ref 0.0–0.5)
EOS PCT: 0 %
HEMATOCRIT: 34.2 % — AB (ref 36.0–46.0)
Hemoglobin: 10.4 g/dL — ABNORMAL LOW (ref 12.0–15.0)
Immature Granulocytes: 1 %
LYMPHS ABS: 1 10*3/uL (ref 0.7–4.0)
LYMPHS PCT: 8 %
MCH: 23.9 pg — AB (ref 26.0–34.0)
MCHC: 30.4 g/dL (ref 30.0–36.0)
MCV: 78.4 fL — AB (ref 80.0–100.0)
Monocytes Absolute: 1.2 10*3/uL — ABNORMAL HIGH (ref 0.1–1.0)
Monocytes Relative: 11 %
NRBC: 0 % (ref 0.0–0.2)
Neutro Abs: 9.3 10*3/uL — ABNORMAL HIGH (ref 1.7–7.7)
Neutrophils Relative %: 80 %
PLATELETS: 249 10*3/uL (ref 150–400)
RBC: 4.36 MIL/uL (ref 3.87–5.11)
RDW: 25.1 % — AB (ref 11.5–15.5)
WBC: 11.7 10*3/uL — ABNORMAL HIGH (ref 4.0–10.5)

## 2018-01-24 LAB — GLUCOSE, CAPILLARY: GLUCOSE-CAPILLARY: 158 mg/dL — AB (ref 70–99)

## 2018-01-24 LAB — LIPASE, BLOOD: Lipase: 19 U/L (ref 11–51)

## 2018-01-24 LAB — URINALYSIS, ROUTINE W REFLEX MICROSCOPIC
Bilirubin Urine: NEGATIVE
GLUCOSE, UA: 50 mg/dL — AB
KETONES UR: 5 mg/dL — AB
Nitrite: NEGATIVE
Protein, ur: 100 mg/dL — AB
RBC / HPF: >50 RBC/hpf — ABNORMAL HIGH (ref 0–5)
Specific Gravity, Urine: 1.015 (ref 1.005–1.030)
pH: 5 (ref 5.0–8.0)

## 2018-01-24 LAB — MAGNESIUM: Magnesium: 1.4 mg/dL — ABNORMAL LOW (ref 1.7–2.4)

## 2018-01-24 MED ORDER — ONDANSETRON HCL 4 MG PO TABS
4.0000 mg | ORAL_TABLET | Freq: Four times a day (QID) | ORAL | Status: DC | PRN
  Administered 2018-01-26: 4 mg via ORAL
  Filled 2018-01-24: qty 1

## 2018-01-24 MED ORDER — AMLODIPINE BESYLATE 5 MG PO TABS
5.0000 mg | ORAL_TABLET | Freq: Every day | ORAL | Status: DC
  Administered 2018-01-24 – 2018-01-26 (×3): 5 mg via ORAL
  Filled 2018-01-24 (×3): qty 1

## 2018-01-24 MED ORDER — ACETAMINOPHEN 650 MG RE SUPP: 650.0000 mg | Freq: Four times a day (QID) | RECTAL | Status: DC | PRN

## 2018-01-24 MED ORDER — ONDANSETRON HCL 4 MG/2ML IJ SOLN: 4.0000 mg | Freq: Four times a day (QID) | INTRAMUSCULAR | Status: DC | PRN

## 2018-01-24 MED ORDER — VENLAFAXINE HCL ER 75 MG PO CP24
75.0000 mg | ORAL_CAPSULE | Freq: Every day | ORAL | Status: DC
  Administered 2018-01-25 – 2018-01-27 (×3): 75 mg via ORAL
  Filled 2018-01-24 (×3): qty 1

## 2018-01-24 MED ORDER — ACETAMINOPHEN 325 MG PO TABS
650.0000 mg | ORAL_TABLET | Freq: Four times a day (QID) | ORAL | Status: DC | PRN
  Administered 2018-01-25 – 2018-01-27 (×5): 650 mg via ORAL
  Filled 2018-01-24 (×5): qty 2

## 2018-01-24 MED ORDER — FENTANYL CITRATE (PF) 100 MCG/2ML IJ SOLN
25.0000 ug | Freq: Once | INTRAMUSCULAR | Status: AC
  Administered 2018-01-24: 25 ug via INTRAVENOUS
  Filled 2018-01-24: qty 2

## 2018-01-24 MED ORDER — INSULIN ASPART 100 UNIT/ML ~~LOC~~ SOLN
0.0000 [IU] | Freq: Three times a day (TID) | SUBCUTANEOUS | Status: DC
  Administered 2018-01-25: 1 [IU] via SUBCUTANEOUS
  Administered 2018-01-25 (×2): 2 [IU] via SUBCUTANEOUS
  Administered 2018-01-26 – 2018-01-27 (×4): 1 [IU] via SUBCUTANEOUS

## 2018-01-24 MED ORDER — TRAZODONE HCL 50 MG PO TABS
50.0000 mg | ORAL_TABLET | Freq: Every evening | ORAL | Status: DC | PRN
  Filled 2018-01-24: qty 1

## 2018-01-24 MED ORDER — SODIUM CHLORIDE 0.9 % IV SOLN
2.0000 g | INTRAVENOUS | Status: DC
  Administered 2018-01-25 – 2018-01-26 (×2): 2 g via INTRAVENOUS
  Filled 2018-01-24 (×2): qty 20
  Filled 2018-01-24: qty 2

## 2018-01-24 MED ORDER — ONDANSETRON HCL 4 MG/2ML IJ SOLN
4.0000 mg | Freq: Once | INTRAMUSCULAR | Status: AC
  Administered 2018-01-24: 4 mg via INTRAVENOUS
  Filled 2018-01-24: qty 2

## 2018-01-24 MED ORDER — FENOFIBRATE 160 MG PO TABS
160.0000 mg | ORAL_TABLET | Freq: Every day | ORAL | Status: DC
  Administered 2018-01-25 – 2018-01-26 (×2): 160 mg via ORAL
  Filled 2018-01-24 (×3): qty 1

## 2018-01-24 MED ORDER — POLYETHYLENE GLYCOL 3350 17 G PO PACK: 17.0000 g | PACK | Freq: Every day | ORAL | Status: DC | PRN

## 2018-01-24 MED ORDER — METFORMIN HCL 500 MG PO TABS
1000.0000 mg | ORAL_TABLET | Freq: Two times a day (BID) | ORAL | Status: DC
  Administered 2018-01-25 – 2018-01-27 (×5): 1000 mg via ORAL
  Filled 2018-01-24 (×5): qty 2

## 2018-01-24 MED ORDER — METFORMIN HCL 500 MG PO TABS
500.0000 mg | ORAL_TABLET | Freq: Every day | ORAL | Status: DC
  Administered 2018-01-25 – 2018-01-26 (×2): 500 mg via ORAL
  Filled 2018-01-24 (×3): qty 1

## 2018-01-24 MED ORDER — ATORVASTATIN CALCIUM 10 MG PO TABS
10.0000 mg | ORAL_TABLET | Freq: Every day | ORAL | Status: DC
  Administered 2018-01-24 – 2018-01-26 (×3): 10 mg via ORAL
  Filled 2018-01-24 (×3): qty 1

## 2018-01-24 MED ORDER — SODIUM CHLORIDE 0.9 % IV BOLUS
500.0000 mL | Freq: Once | INTRAVENOUS | Status: AC
  Administered 2018-01-24: 500 mL via INTRAVENOUS

## 2018-01-24 MED ORDER — ENOXAPARIN SODIUM 80 MG/0.8ML ~~LOC~~ SOLN
70.0000 mg | SUBCUTANEOUS | Status: DC
  Administered 2018-01-24 – 2018-01-25 (×2): 70 mg via SUBCUTANEOUS
  Filled 2018-01-24 (×2): qty 0.8

## 2018-01-24 MED ORDER — POTASSIUM CHLORIDE IN NACL 20-0.9 MEQ/L-% IV SOLN
INTRAVENOUS | Status: AC
  Administered 2018-01-24: 23:00:00 via INTRAVENOUS

## 2018-01-24 MED ORDER — POTASSIUM CHLORIDE CRYS ER 20 MEQ PO TBCR
40.0000 meq | EXTENDED_RELEASE_TABLET | Freq: Two times a day (BID) | ORAL | Status: AC
  Administered 2018-01-24 – 2018-01-25 (×2): 40 meq via ORAL
  Filled 2018-01-24 (×2): qty 2

## 2018-01-24 MED ORDER — METFORMIN HCL 500 MG PO TABS: 500.0000 mg | ORAL_TABLET | ORAL | Status: DC

## 2018-01-24 MED ORDER — PIPERACILLIN-TAZOBACTAM 3.375 G IVPB 30 MIN
3.3750 g | Freq: Once | INTRAVENOUS | Status: AC
  Administered 2018-01-24: 3.375 g via INTRAVENOUS
  Filled 2018-01-24: qty 50

## 2018-01-24 NOTE — ED Provider Notes (Signed)
[facility] EMERGENCY DEPARTMENT Provider Note   CSN: [id number] Arrival date & time: 01/24/18  1642     History   Chief Complaint Chief Complaint  Patient presents with  . Urinary Tract Infection    HPI Felicia Hogan is a 72 y.o. female.  HPI Felicia Hogan with concern of fever, right-sided abdominal pain, dysuria. Patient has multiple medical issues, and after developing dysuria last week she went to her urologist. After positive urine culture results were obtained she was started on Macrobid, this was 4 days ago. She notes that in spite of taking the medication continued she continues to have nausea, fever, generalized discomfort including headache, right-sided abdominal pain. There is no vomiting, no diarrhea, there is ongoing dysuria. Patient also complains of production of bloody sputum, though without clear cough.  Past Medical History:  Diagnosis Date  . Anxiety   . Arthritis   . Depression   . Diabetes mellitus without complication ([place])   . Dyslipidemia   . History of kidney stones   . Hypertension   . Iron deficiency anemia due to chronic blood loss   . Mucous membrane pemphigoid 2014   so far only infects mouth. treated with oral steroids for flares. Lidex as well.   . Sleep apnea     Patient Active Problem List   Diagnosis Date Noted  . IDA (iron deficiency anemia) 10/19/2017  . Heme positive stool 10/19/2017  . RUQ pain 10/19/2017    Past Surgical History:  Procedure Laterality Date  . BIOPSY  11/17/2017   Procedure: BIOPSY;  Surgeon: Danie Binder, MD;  Location: [facility];  Service: Endoscopy;;  gastric duodenum  . CESAREAN SECTION     twice  . CHOLECYSTECTOMY    . COLONOSCOPY  10/2012   Dr. West Carbo: normal. due 10/2017.   Marland Kitchen COLONOSCOPY WITH PROPOFOL N/A 11/17/2017   Procedure: COLONOSCOPY WITH PROPOFOL;  Surgeon: Danie Binder, MD;  Location: [facility];  Service: Endoscopy;  Laterality: N/A;  8:45am  . ESOPHAGOGASTRODUODENOSCOPY (EGD)  WITH PROPOFOL N/A 11/17/2017   Procedure: ESOPHAGOGASTRODUODENOSCOPY (EGD) WITH PROPOFOL;  Surgeon: Danie Binder, MD;  Location: [facility];  Service: Endoscopy;  Laterality: N/A;  . GIVENS CAPSULE STUDY N/A 11/30/2017   Procedure: GIVENS CAPSULE STUDY;  Surgeon: Danie Binder, MD;  Location: [facility];  Service: Endoscopy;  Laterality: N/A;  7:30am  . LITHOTRIPSY  2017  . POLYPECTOMY  11/17/2017   Procedure: POLYPECTOMY;  Surgeon: Danie Binder, MD;  Location: [facility];  Service: Endoscopy;;  colon   . REPLACEMENT TOTAL KNEE BILATERAL    . TONSILLECTOMY    . TOTAL HIP ARTHROPLASTY Right      OB History   None      Home Medications    Prior to Admission medications   Medication Sig Start Date End Date Taking? Authorizing Provider  acetaminophen (TYLENOL) 500 MG tablet Take 1,000 mg by mouth 3 (three) times daily as needed for moderate pain or headache.   Yes [provider]  amLODipine (NORVASC) 5 MG tablet Take 5 mg by mouth daily with supper.  07/27/09  Yes [provider]  atorvastatin (LIPITOR) 20 MG tablet Take 10 mg by mouth daily with supper.   Yes [provider]  cetirizine (ZYRTEC) 10 MG tablet Take 10 mg by mouth at bedtime as needed for allergies.   Yes [provider]  Cholecalciferol (VITAMIN D3) 5000 units TABS Take 5,000 Units by mouth daily with supper.  Yes [provider]  desvenlafaxine (PRISTIQ) 50 MG 24 hr tablet Take 50 mg by mouth daily.   Yes [provider]  dexamethasone (DECADRON) 0.5 MG/5ML solution Take 5 mLs by mouth 2 (two) times daily as needed (oral sores).  10/12/15  Yes [provider]  fenofibrate 160 MG tablet Take 160 mg by mouth daily with lunch.  08/30/15  Yes [provider]  fluocinonide gel (LIDEX) 5.28 % 1 application See admin instructions. Apply orally (dont swallow) 3 times daily as needed for mouth sores 12/31/09  Yes [provider]  ibuprofen  (ADVIL,MOTRIN) 200 MG tablet Take 400 mg by mouth 3 (three) times daily as needed for headache or moderate pain.   Yes [provider]  metFORMIN (GLUCOPHAGE) 1000 MG tablet Take 500-1,000 mg by mouth See admin instructions. Take 1000 mg with breakfast, 500 mg with lunch, and 1000 mg with dinner 05/22/15  Yes [provider]  Multiple Vitamin (MULTIVITAMIN) tablet Take 1 tablet by mouth daily with lunch.    Yes [provider]  Multiple Vitamins-Minerals (PRESERVISION AREDS PO) Take 1 capsule by mouth 2 (two) times daily.    Yes [provider]  nitrofurantoin, macrocrystal-monohydrate, (MACROBID) 100 MG capsule Take 1 capsule by mouth 2 (two) times daily. Starting 01/21/2018 x 10 days. 01/21/18  Yes [provider]  olmesartan-hydrochlorothiazide (BENICAR HCT) 20-12.5 MG tablet Take 1 tablet by mouth daily.   Yes [provider]  Omega-3 Fatty Acids (FISH OIL) 1200 MG CAPS Take 1,200 mg by mouth 3 (three) times daily.    Yes [provider]  omeprazole (PRILOSEC) 20 MG capsule 1 PO 30 MINS PRIOR TO BREAKFAST. 11/17/17  Yes Fields, Sandi L, MD  Polyethyl Glycol-Propyl Glycol (SYSTANE ULTRA OP) Place 1 drop into both eyes 4 (four) times daily as needed (dry eyes).   Yes [provider]  ranitidine (ZANTAC) 150 MG tablet Take 150 mg by mouth at bedtime as needed for heartburn.    Yes [provider]  tamsulosin (FLOMAX) 0.4 MG CAPS capsule Take 0.4 mg by mouth daily as needed (kidney stone).  03/19/16  Yes [provider]    Family History Family History  Problem Relation Age of Onset  . Liver cancer Mother        ?started in pancreas  . Parkinson's disease Father   . Pneumonia Father   . Colon cancer Father 67       metastatic but did well  . Thyroid cancer Father   . Other Daughter 10       brain tumor, followed with serial MRIs. now age 33  . Colon cancer Paternal Aunt 90    Social History Social  History   Tobacco Use  . Smoking status: Former Smoker    Packs/day: 0.25    Years: 1.00    Pack years: 0.25    Types: Cigarettes    Last attempt to quit: 11/14/1967    Years since quitting: 50.2  . Smokeless tobacco: Never Used  Substance Use Topics  . Alcohol use: Yes    Comment: rare  . Drug use: Never     Allergies   Nsaids; Erythromycin; Sulfa antibiotics; and Tape   Review of Systems Review of Systems  Constitutional:       Per HPI, otherwise negative  HENT:       Per HPI, otherwise negative  Respiratory:       Per HPI, otherwise negative  Cardiovascular:  Per HPI, otherwise negative  Gastrointestinal: Positive for nausea. Negative for vomiting.  Endocrine:       Negative aside from HPI  Genitourinary:       Neg aside from HPI   Musculoskeletal:       Per HPI, otherwise negative  Skin: Negative.   Neurological: Positive for headaches. Negative for syncope.     Physical Exam Updated Vital Signs BP 133/64 (BP Location: Right Arm)   Pulse (!) 102   Temp 99.3 F (37.4 C) (Oral)   Resp 20   Ht 5\' 5"  (1.651 m)   Wt 129.7 kg   SpO2 96%   BMI 47.59 kg/m   Physical Exam  Constitutional: She is oriented to person, place, and time. She appears well-developed and well-nourished. No distress.  Obese elderly female awake and alert, uncomfortable in appearance  HENT:  Head: Normocephalic and atraumatic.  Eyes: Conjunctivae and EOM are normal.  Cardiovascular: Regular rhythm.  Tachycardia  Pulmonary/Chest: Effort normal and breath sounds normal. No stridor. No respiratory distress.  Abdominal: She exhibits no distension.  Though the patient complains of right-sided abdominal pain she has no tenderness to palpation, no guarding.  Musculoskeletal: She exhibits no edema.  Neurological: She is alert and oriented to person, place, and time. No cranial nerve deficit.  Skin: Skin is warm and dry.  Psychiatric: She has a normal mood and affect.  Nursing note  and vitals reviewed.    ED Treatments / Results  Labs (all labs ordered are listed, but only abnormal results are displayed) Labs Reviewed  COMPREHENSIVE METABOLIC PANEL - Abnormal; Notable for the following components:      Result Value   Sodium 133 (*)    Potassium 3.3 (*)    Glucose, Bld 232 (*)    Albumin 3.1 (*)    All other components within normal limits  CBC WITH DIFFERENTIAL/PLATELET - Abnormal; Notable for the following components:   WBC 11.7 (*)    Hemoglobin 10.4 (*)    HCT 34.2 (*)    MCV 78.4 (*)    MCH 23.9 (*)    RDW 25.1 (*)    Neutro Abs 9.3 (*)    Monocytes Absolute 1.2 (*)    Abs Immature Granulocytes 0.13 (*)    All other components within normal limits  URINALYSIS, ROUTINE W REFLEX MICROSCOPIC - Abnormal; Notable for the following components:   Color, Urine AMBER (*)    APPearance HAZY (*)    Glucose, UA 50 (*)    Hgb urine dipstick MODERATE (*)    Ketones, ur 5 (*)    Protein, ur 100 (*)    Leukocytes, UA MODERATE (*)    RBC / HPF >50 (*)    WBC, UA >50 (*)    Bacteria, UA RARE (*)    Non Squamous Epithelial 0-5 (*)    All other components within normal limits  LIPASE, BLOOD    EKG None  Radiology Dg Chest 2 View  Result Date: 01/24/2018 CLINICAL DATA:  Fever.  Bloody sputum. EXAM: CHEST - 2 VIEW COMPARISON:  None. FINDINGS: Tiny pleural effusions seen on the lateral view. The heart, hila, mediastinum, lungs, and pleura are otherwise unremarkable. IMPRESSION: Tiny pleural effusions.  No other abnormalities. Electronically Signed   By: Dorise Bullion III M.D   On: 01/24/2018 17:49   Ct Renal Stone Study  Result Date: 01/24/2018 CLINICAL DATA:  History of UTI and flank pain, initial encounter EXAM: CT ABDOMEN AND PELVIS WITHOUT CONTRAST TECHNIQUE:  Multidetector CT imaging of the abdomen and pelvis was performed following the standard protocol without IV contrast. COMPARISON:  None. FINDINGS: Lower chest: Lung bases demonstrate no focal  infiltrate or sizable effusion. A 5 mm subpleural nodular density is noted in the left lower lobe best seen on image number 10 of series 4. Hepatobiliary: No focal liver abnormality is seen. Status post cholecystectomy. No biliary dilatation. Pancreas: Unremarkable. No pancreatic ductal dilatation or surrounding inflammatory changes. Spleen: Normal in size without focal abnormality. Adrenals/Urinary Tract: Adrenal glands are within normal limits bilaterally. The left kidney demonstrates a central renal pelvic stone measuring 12 mm. No obstructive changes are seen. Bladder is partially distended. Right kidney demonstrates significant perinephric stranding and appears somewhat enlarged when compared with the left. Tiny nonobstructing stone is noted in the right mid to lower pole. No significant hydronephrosis is noted although some thickening of the renal pelvis is seen. This may be related to the underlying UTI. No ureteral stone or obstructive changes are noted. Stomach/Bowel: No obstructive or inflammatory changes of the bowel are seen. The appendix is within normal limits. Laxity of the anterior abdominal wall is seen with large panniculus anteriorly. Vascular/Lymphatic: No significant vascular findings are present. No enlarged abdominal or pelvic lymph nodes. Reproductive: Uterus and bilateral adnexa are unremarkable. Other: No abdominal wall hernia or abnormality. No abdominopelvic ascites. Musculoskeletal: No acute or significant osseous findings. Right hip replacement is noted. IMPRESSION: 12 mm nonobstructing left renal pelvic stone. Tiny nonobstructing right renal stone. Perinephric stranding as well as some enlargement of the right kidney and thickening of Gerota's fascia. This may be related to changes from recently passed stone. Possibility of pyelonephritis deserves consideration. Left lower lobe 5 mm subpleural nodule. No follow-up needed if patient is low-risk. Non-contrast chest CT can be considered  in 12 months if patient is high-risk. This recommendation follows the consensus statement: Guidelines for Management of Incidental Pulmonary Nodules Detected on CT Images: From the Fleischner Society 2017; Radiology 2017; 284:228-243. Electronically Signed   By: Inez Catalina M.D.   On: 01/24/2018 19:45    Procedures Procedures (including critical care time)  Medications Ordered in ED Medications  piperacillin-tazobactam (ZOSYN) IVPB 3.375 g (has no administration in time range)  sodium chloride 0.9 % bolus 500 mL (0 mLs Intravenous Stopped 01/24/18 1814)  ondansetron (ZOFRAN) injection 4 mg (4 mg Intravenous Given 01/24/18 1737)  fentaNYL (SUBLIMAZE) injection 25 mcg (25 mcg Intravenous Given 01/24/18 1737)     Initial Impression / Assessment and Plan / ED Course  I have reviewed the triage vital signs and the nursing notes.  Pertinent labs & imaging results that were available during my care of the patient were reviewed by me and considered in my medical decision making (see chart for details).     Update: Patient remains uncomfortable. Initial x-ray reassuring, but labs notable for leukocytosis, and evidence for persistent urinary tract infection. Given the patient's ongoing therapy with Macrobid, these concerning findings, patient will CT scan performed.    8:06 PM All results discussed with patient and her husband. CT suggest pyelonephritis, and given the patient's development of this, in spite of taking appropriate antibiotics, she will receive Zosyn, IV, required admission for further evaluation, management, monitoring. CT scan otherwise generally reassuring aside from demonstration of multiple kidney stones.   Final Clinical Impressions(s) / ED Diagnoses  Pyelonephritis   Carmin Muskrat, MD 01/24/18 2007

## 2018-01-24 NOTE — ED Triage Notes (Signed)
She has a uti, fever may passed a kidney stone headache bloody congestion and incontinent

## 2018-01-24 NOTE — H&P (Addendum)
History and Physical    Felicia Hogan KNL:[id number] DOB: May 29, 1945 DOA: 01/24/2018  PCP: Earney Mallet, MD   Patient coming from: Home   I have personally briefly reviewed patient's old medical records in [place]  Chief Complaint: Dysuria, right-sided abdominal pain  HPI: Felicia Hogan is a 72 y.o. female with medical history significant for DM, HTN, depression with anxiety, kidney stones who presented with complaints of burning with urination, right-sided abdominal pain, with fevers- 103- 104 at home and chills over the last few days.  Patient went to her urologist [facility], was diagnosed with a UTI following urine culture sensitivities patient was prescribed Macrobid, likely by NP-April Cassidy.  But with persistence of symptoms despite 4 days of Macrobid, patient decided to come to the ED. Patient is not aware of culture results. She denies chest pain or shortness of breath, no vomiting, reports 2 loose stools today.  Patient is on tamsulosin PRN for kidney stones, she believes she passed a kidney stone 3 days ago.   ED Course: T-max 99.3, initial tachycardia 102.  WBC 11.7.  Lipase 19.  UA moderate leukocytes, >50 WBC, bacteria.  CT Renal stone study-perinephric stranding with some enlargement of the right kidney-recently passed stone, possibility of pyelonephritis.  Nonobstructing left renal pelvic and right renal stones.LLL 5 mm subpleural nodule.  2 view chest x-ray tiny pleural effusions.  Considering failure of outpatient antibiotic- Macrobid, patient was started on IV Zosyn hospitalist called to admit for pyelonephritis.  Review of Systems: As per HPI all other systems reviewed and negative.  Past Medical History:  Diagnosis Date  . Anxiety   . Arthritis   . Depression   . Diabetes mellitus without complication ([place])   . Dyslipidemia   . History of kidney stones   . Hypertension   . Iron deficiency anemia due to chronic blood loss     . Mucous membrane pemphigoid 2014   so far only infects mouth. treated with oral steroids for flares. Lidex as well.   . Sleep apnea     Past Surgical History:  Procedure Laterality Date  . BIOPSY  11/17/2017   Procedure: BIOPSY;  Surgeon: Danie Binder, MD;  [facility];  Service: Endoscopy;;  gastric duodenum  . CESAREAN SECTION     twice  . CHOLECYSTECTOMY    . COLONOSCOPY  10/2012   Dr. West Carbo: normal. due 10/2017.   Marland Kitchen COLONOSCOPY WITH PROPOFOL N/A 11/17/2017   Procedure: COLONOSCOPY WITH PROPOFOL;  Surgeon: Danie Binder, MD;  [facility];  Service: Endoscopy;  Laterality: N/A;  8:45am  . ESOPHAGOGASTRODUODENOSCOPY (EGD) WITH PROPOFOL N/A 11/17/2017   Procedure: ESOPHAGOGASTRODUODENOSCOPY (EGD) WITH PROPOFOL;  Surgeon: Danie Binder, MD;  [facility];  Service: Endoscopy;  Laterality: N/A;  . GIVENS CAPSULE STUDY N/A 11/30/2017   Procedure: GIVENS CAPSULE STUDY;  Surgeon: Danie Binder, MD;  [facility];  Service: Endoscopy;  Laterality: N/A;  7:30am  . LITHOTRIPSY  2017  . POLYPECTOMY  11/17/2017   Procedure: POLYPECTOMY;  Surgeon: Danie Binder, MD;  [facility];  Service: Endoscopy;;  colon   . REPLACEMENT TOTAL KNEE BILATERAL    . TONSILLECTOMY    . TOTAL HIP ARTHROPLASTY Right      reports that she quit smoking about 50 years ago. Her smoking use included cigarettes. She has a 0.25 pack-year smoking history. She has never used smokeless tobacco. She reports that she drinks alcohol.  She reports that she does not use drugs.  Allergies  Allergen Reactions  . Nsaids Itching    If given in large doses.   . Erythromycin Hives and Rash  . Sulfa Antibiotics Rash  . Tape Itching and Rash    Family History  Problem Relation Age of Onset  . Liver cancer Mother        ?started in pancreas  . Parkinson's disease Father   . Pneumonia Father   . Colon cancer Father 67       metastatic but did well  .  Thyroid cancer Father   . Other Daughter 10       brain tumor, followed with serial MRIs. now age 12  . Colon cancer Paternal Aunt 90    Prior to Admission medications   Medication Sig Start Date End Date Taking? Authorizing Provider  acetaminophen (TYLENOL) 500 MG tablet Take 1,000 mg by mouth 3 (three) times daily as needed for moderate pain or headache.   Yes [provider]  amLODipine (NORVASC) 5 MG tablet Take 5 mg by mouth daily with supper.  07/27/09  Yes [provider]  atorvastatin (LIPITOR) 20 MG tablet Take 10 mg by mouth daily with supper.   Yes [provider]  cetirizine (ZYRTEC) 10 MG tablet Take 10 mg by mouth at bedtime as needed for allergies.   Yes [provider]  Cholecalciferol (VITAMIN D3) 5000 units TABS Take 5,000 Units by mouth daily with supper.    Yes [provider]  desvenlafaxine (PRISTIQ) 50 MG 24 hr tablet Take 50 mg by mouth daily.   Yes [provider]  dexamethasone (DECADRON) 0.5 MG/5ML solution Take 5 mLs by mouth 2 (two) times daily as needed (oral sores).  10/12/15  Yes [provider]  fenofibrate 160 MG tablet Take 160 mg by mouth daily with lunch.  08/30/15  Yes [provider]  fluocinonide gel (LIDEX) 2.13 % 1 application See admin instructions. Apply orally (dont swallow) 3 times daily as needed for mouth sores 12/31/09  Yes [provider]  ibuprofen (ADVIL,MOTRIN) 200 MG tablet Take 400 mg by mouth 3 (three) times daily as needed for headache or moderate pain.   Yes [provider]  metFORMIN (GLUCOPHAGE) 1000 MG tablet Take 500-1,000 mg by mouth See admin instructions. Take 1000 mg with breakfast, 500 mg with lunch, and 1000 mg with dinner 05/22/15  Yes [provider]  Multiple Vitamin (MULTIVITAMIN) tablet Take 1 tablet by mouth daily with lunch.    Yes [provider]  Multiple Vitamins-Minerals (PRESERVISION AREDS PO) Take 1 capsule by mouth  2 (two) times daily.    Yes [provider]  nitrofurantoin, macrocrystal-monohydrate, (MACROBID) 100 MG capsule Take 1 capsule by mouth 2 (two) times daily. Starting 01/21/2018 x 10 days. 01/21/18  Yes [provider]  olmesartan-hydrochlorothiazide (BENICAR HCT) 20-12.5 MG tablet Take 1 tablet by mouth daily.   Yes [provider]  Omega-3 Fatty Acids (FISH OIL) 1200 MG CAPS Take 1,200 mg by mouth 3 (three) times daily.    Yes [provider]  omeprazole (PRILOSEC) 20 MG capsule 1 PO 30 MINS PRIOR TO BREAKFAST. 11/17/17  Yes Fields, Sandi L, MD  Polyethyl Glycol-Propyl Glycol (SYSTANE ULTRA OP) Place 1 drop into both eyes 4 (four) times daily as needed (dry eyes).   Yes [provider]  ranitidine (ZANTAC) 150 MG tablet Take 150 mg by mouth at bedtime as needed for heartburn.  Yes [provider]  tamsulosin (FLOMAX) 0.4 MG CAPS capsule Take 0.4 mg by mouth daily as needed (kidney stone).  03/19/16  Yes [provider]    Physical Exam: Vitals:   01/24/18 1659 01/24/18 1701 01/24/18 1703  BP:  133/64   Pulse:  (!) 102   Resp:  20   Temp:   99.3 F (37.4 C)  TempSrc:  Oral Oral  SpO2:  96%   Weight: 129.7 kg    Height: 5\' 5"  (1.651 m)      Constitutional: NAD, calm, comfortable Vitals:   01/24/18 1659 01/24/18 1701 01/24/18 1703  BP:  133/64   Pulse:  (!) 102   Resp:  20   Temp:   99.3 F (37.4 C)  TempSrc:  Oral Oral  SpO2:  96%   Weight: 129.7 kg    Height: 5\' 5"  (1.651 m)     Eyes: PERRL, lids and conjunctivae normal ENMT: Mucous membranes are moist. Posterior pharynx clear of any exudate or lesions. Neck: normal, supple, no masses, no thyromegaly Respiratory: clear to auscultation bilaterally, no wheezing, no crackles. Normal respiratory effort. No accessory muscle use.  Cardiovascular: Regular rate and rhythm, no murmurs / rubs / gallops. No extremity edema. 2+ pedal pulses. No carotid bruits.  Abdomen:  morbidly Obese abdomen, no tenderness, no masses palpated. No hepatosplenomegaly. Bowel sounds positive.  Musculoskeletal: no clubbing / cyanosis. No joint deformity upper and lower extremities. Good ROM, no contractures. Normal muscle tone.  Skin: no rashes, lesions, ulcers. No induration Neurologic: CN 2-12 grossly intact.  Strength 5/5 in all 4.  Psychiatric: Normal judgment and insight. Alert and oriented x 3. Normal mood.   Labs on Admission: I have personally reviewed following labs and imaging studies  CBC: Recent Labs  Lab 01/24/18 1739  WBC 11.7*  NEUTROABS 9.3*  HGB 10.4*  HCT 34.2*  MCV 78.4*  PLT 308   Basic Metabolic Panel: Recent Labs  Lab 01/24/18 1739  NA 133*  K 3.3*  CL 100  CO2 24  GLUCOSE 232*  BUN 18  CREATININE 0.91  CALCIUM 9.2   GFR: Estimated Creatinine Clearance: 78.2 mL/min (by C-G formula based on SCr of 0.91 mg/dL). Liver Function Tests: Recent Labs  Lab 01/24/18 1739  AST 31  ALT 35  ALKPHOS 57  BILITOT 1.1  PROT 6.7  ALBUMIN 3.1*   Recent Labs  Lab 01/24/18 1739  LIPASE 19   Urine analysis:    Component Value Date/Time   COLORURINE AMBER (A) 01/24/2018 1820   APPEARANCEUR HAZY (A) 01/24/2018 1820   LABSPEC 1.015 01/24/2018 1820   PHURINE 5.0 01/24/2018 1820   GLUCOSEU 50 (A) 01/24/2018 1820   HGBUR MODERATE (A) 01/24/2018 1820   BILIRUBINUR NEGATIVE 01/24/2018 1820   KETONESUR 5 (A) 01/24/2018 1820   PROTEINUR 100 (A) 01/24/2018 1820   NITRITE NEGATIVE 01/24/2018 1820   LEUKOCYTESUR MODERATE (A) 01/24/2018 1820    Radiological Exams on Admission: Dg Chest 2 View  Result Date: 01/24/2018 CLINICAL DATA:  Fever.  Bloody sputum. EXAM: CHEST - 2 VIEW COMPARISON:  None. FINDINGS: Tiny pleural effusions seen on the lateral view. The heart, hila, mediastinum, lungs, and pleura are otherwise unremarkable. IMPRESSION: Tiny pleural effusions.  No other abnormalities. Electronically Signed   By: Dorise Bullion III M.D   On:  01/24/2018 17:49   Ct Renal Stone Study  Result Date: 01/24/2018 CLINICAL DATA:  History of UTI and flank pain, initial encounter EXAM: CT ABDOMEN AND PELVIS WITHOUT  CONTRAST TECHNIQUE: Multidetector CT imaging of the abdomen and pelvis was performed following the standard protocol without IV contrast. COMPARISON:  None. FINDINGS: Lower chest: Lung bases demonstrate no focal infiltrate or sizable effusion. A 5 mm subpleural nodular density is noted in the left lower lobe best seen on image number 10 of series 4. Hepatobiliary: No focal liver abnormality is seen. Status post cholecystectomy. No biliary dilatation. Pancreas: Unremarkable. No pancreatic ductal dilatation or surrounding inflammatory changes. Spleen: Normal in size without focal abnormality. Adrenals/Urinary Tract: Adrenal glands are within normal limits bilaterally. The left kidney demonstrates a central renal pelvic stone measuring 12 mm. No obstructive changes are seen. Bladder is partially distended. Right kidney demonstrates significant perinephric stranding and appears somewhat enlarged when compared with the left. Tiny nonobstructing stone is noted in the right mid to lower pole. No significant hydronephrosis is noted although some thickening of the renal pelvis is seen. This may be related to the underlying UTI. No ureteral stone or obstructive changes are noted. Stomach/Bowel: No obstructive or inflammatory changes of the bowel are seen. The appendix is within normal limits. Laxity of the anterior abdominal wall is seen with large panniculus anteriorly. Vascular/Lymphatic: No significant vascular findings are present. No enlarged abdominal or pelvic lymph nodes. Reproductive: Uterus and bilateral adnexa are unremarkable. Other: No abdominal wall hernia or abnormality. No abdominopelvic ascites. Musculoskeletal: No acute or significant osseous findings. Right hip replacement is noted. IMPRESSION: 12 mm nonobstructing left renal pelvic stone.  Tiny nonobstructing right renal stone. Perinephric stranding as well as some enlargement of the right kidney and thickening of Gerota's fascia. This may be related to changes from recently passed stone. Possibility of pyelonephritis deserves consideration. Left lower lobe 5 mm subpleural nodule. No follow-up needed if patient is low-risk. Non-contrast chest CT can be considered in 12 months if patient is high-risk. This recommendation follows the consensus statement: Guidelines for Management of Incidental Pulmonary Nodules Detected on CT Images: From the Fleischner Society 2017; Radiology 2017; 284:228-243. Electronically Signed   By: Inez Catalina M.D.   On: 01/24/2018 19:45    EKG: None.  Assessment/Plan Principal Problem:   Acute pyelonephritis Active Problems:   DM (diabetes mellitus) ([place])   Essential hypertension   Acute pyelonephritis-dysuria, right-sided abdominal pain, fevers chills.  T-max 99.3 in ED. WBC 11.7.  Tachycardia 102.  UA suggestive of infection with leukocytes. CT abdomen-nephric stranding as well as enlargement of the right kidney - pyelonephritis versus recently passed stone.  Nonobstructing kidney stones. IV Zosyn started in ED.  -Continue antibiotics with IV ceftriaxone 2 g daily -Add on urine cultures -Continue PRN tamsulosin -Hydrate normal saline +20 KCl 100 cc/h x 10 hrs  Subpleural nodule-incidental finding on CT renal stone study-no follow-up needed if patient is low risk.  Noncontrast chest CT can be considered in 12 months if patient is high risk. -Follow-up outpatient  DM-glucose 232.  No A1c on file. -Continue home metformin - SSI  HTN-systolic [phone].  -cont home meds Norvasc 5mg  daily -Hold home olmesartan-HCTZ for now  Mild hypokalemia- k- 3.3 -Replete  -Check magnesium -BMP a.m.  Depression-  -Continue home desvenlafaxine  HIV as part of routine health screening  DVT prophylaxis: Lovenox Code Status: full Family Communication:  Significant order/spouse at bedside Disposition Plan: The rounding team Consults called:none Admission status: inpt, tele   Bethena Roys MD [facility] Pager [phone] From 3PM-11PM.  Otherwise please contact night-coverage [email or web address] Password [email or web address]  01/24/2018, 9:15 PM

## 2018-01-25 DIAGNOSIS — E119 Type 2 diabetes mellitus without complications: Secondary | ICD-10-CM

## 2018-01-25 DIAGNOSIS — N10 Acute tubulo-interstitial nephritis: Principal | ICD-10-CM

## 2018-01-25 DIAGNOSIS — I10 Essential (primary) hypertension: Secondary | ICD-10-CM

## 2018-01-25 DIAGNOSIS — J0110 Acute frontal sinusitis, unspecified: Secondary | ICD-10-CM | POA: Diagnosis present

## 2018-01-25 LAB — BASIC METABOLIC PANEL
Anion gap: 8 (ref 5–15)
BUN: 17 mg/dL (ref 8–23)
CALCIUM: 8.9 mg/dL (ref 8.9–10.3)
CO2: 25 mmol/L (ref 22–32)
CREATININE: 0.96 mg/dL (ref 0.44–1.00)
Chloride: 104 mmol/L (ref 98–111)
GFR calc Af Amer: >60 mL/min (ref >60)
GFR, EST NON AFRICAN AMERICAN: 59 mL/min — AB (ref >60)
GLUCOSE: 168 mg/dL — AB (ref 70–99)
Potassium: 3.9 mmol/L (ref 3.5–5.1)
SODIUM: 137 mmol/L (ref 135–145)

## 2018-01-25 LAB — CBC
HCT: 31.5 % — ABNORMAL LOW (ref 36.0–46.0)
HEMOGLOBIN: 9.7 g/dL — AB (ref 12.0–15.0)
MCH: 24.7 pg — ABNORMAL LOW (ref 26.0–34.0)
MCHC: 30.8 g/dL (ref 30.0–36.0)
MCV: 80.2 fL (ref 80.0–100.0)
PLATELETS: 240 10*3/uL (ref 150–400)
RBC: 3.93 MIL/uL (ref 3.87–5.11)
RDW: 24.9 % — ABNORMAL HIGH (ref 11.5–15.5)
WBC: 11.4 10*3/uL — AB (ref 4.0–10.5)
nRBC: 0 % (ref 0.0–0.2)

## 2018-01-25 LAB — GLUCOSE, CAPILLARY
GLUCOSE-CAPILLARY: 158 mg/dL — AB (ref 70–99)
GLUCOSE-CAPILLARY: 176 mg/dL — AB (ref 70–99)
Glucose-Capillary: 139 mg/dL — ABNORMAL HIGH (ref 70–99)
Glucose-Capillary: 135 mg/dL — ABNORMAL HIGH (ref 70–99)

## 2018-01-25 MED ORDER — GUAIFENESIN ER 600 MG PO TB12
1200.0000 mg | ORAL_TABLET | Freq: Two times a day (BID) | ORAL | Status: DC
  Administered 2018-01-25 – 2018-01-27 (×4): 1200 mg via ORAL
  Filled 2018-01-25 (×5): qty 2

## 2018-01-25 MED ORDER — SODIUM CHLORIDE 0.9 % IV SOLN
INTRAVENOUS | Status: AC
  Filled 2018-01-25: qty 20

## 2018-01-25 MED ORDER — POTASSIUM CHLORIDE IN NACL 20-0.9 MEQ/L-% IV SOLN
INTRAVENOUS | Status: AC
  Administered 2018-01-25: 10:00:00 via INTRAVENOUS

## 2018-01-25 MED ORDER — KETOROLAC TROMETHAMINE 15 MG/ML IJ SOLN
15.0000 mg | Freq: Once | INTRAMUSCULAR | Status: AC
  Administered 2018-01-25: 15 mg via INTRAVENOUS
  Filled 2018-01-25: qty 1

## 2018-01-25 MED ORDER — SALINE SPRAY 0.65 % NA SOLN: 1.0000 | NASAL | Status: DC | PRN

## 2018-01-25 NOTE — Progress Notes (Signed)
PROGRESS NOTE    Felicia Hogan  IRJ:[id number] DOB: [birth year] DOA: 01/24/2018 PCP: Earney Mallet, MD    Brief Narrative:  72 year old female with a history of hypertension diabetes, kidney stones, who presented with dysuria, right-sided abdominal pain and fevers.  She was recently seen her urologist office and was told she had a urinary tract infection.  She completed a course of Macrobid, but her symptoms persisted.  She came to the hospital for evaluation where he analysis indicated persistent infection she was noted to be febrile.  She was admitted for further treatments.   Assessment & Plan:   Principal Problem:   Acute pyelonephritis Active Problems:   DM (diabetes mellitus) ([place])   Essential hypertension   1. Acute pyelonephritis.  Continues to have fevers overnight.  CT scan indicated left-sided perinephric stranding.  Urine culture is in process.  Continue on IV Rocephin.  We will try and obtain urine culture results from her urologist. 2. Subpleural nodule.  Incidental finding on CT study.  Will need noncontrast chest CT in the next 12 months for surveillance. 3. Diabetes.  Blood sugars have been stable.  Continue on metformin and sliding scale insulin. 4. Hypertension.  Blood pressure currently stable on amlodipine. 5. Hyperlipidemia.  Continue statin   DVT prophylaxis: Lovenox Code Status: Full code Family Communication: Discussed with husband at the bedside Disposition Plan: Discharge home once improved   Consultants:     Procedures:     Antimicrobials:   Ceftriaxone 11/3 >   Subjective: No complaints of back pain/flank pain.  She has a mild cough.  She does complain of some headache and nasal congestion.  Objective: Vitals:   01/24/18 2128 01/25/18 0640 01/25/18 0955 01/25/18 1338  BP: 139/71 (!) 103/52  (!) 111/45  Pulse: (!) 103 88  78  Resp: 18 18  18   Temp: 100.1 F (37.8 C) (!) 101 F (38.3 C)  98.2 F (36.8 C)  TempSrc: Oral Oral  Oral   SpO2: 100% 90% 92% 94%  Weight: 127.6 kg     Height: 5\' 5"  (1.651 m)       Intake/Output Summary (Last 24 hours) at 01/25/2018 1924 Last data filed at 01/25/2018 1300 Gross per 24 hour  Intake 1659.27 ml  Output 1650 ml  Net 9.27 ml   Filed Weights   01/24/18 1659 01/24/18 2128  Weight: 129.7 kg 127.6 kg    Examination:  General exam: Appears calm and comfortable, she does have some frontal sinus tenderness Respiratory system: Clear to auscultation. Respiratory effort normal. Cardiovascular system: S1 & S2 heard, RRR. No JVD, murmurs, rubs, gallops or clicks. No pedal edema. Gastrointestinal system: Abdomen is nondistended, soft and nontender. No organomegaly or masses felt. Normal bowel sounds heard. Central nervous system: Alert and oriented. No focal neurological deficits. Extremities: Symmetric 5 x 5 power. Skin: No rashes, lesions or ulcers Psychiatry: Judgement and insight appear normal. Mood & affect appropriate.     Data Reviewed: I have personally reviewed following labs and imaging studies  CBC: Recent Labs  Lab 01/24/18 1739 01/25/18 0459  WBC 11.7* 11.4*  NEUTROABS 9.3*  --   HGB 10.4* 9.7*  HCT 34.2* 31.5*  MCV 78.4* 80.2  PLT 249 301   Basic Metabolic Panel: Recent Labs  Lab 01/24/18 1739 01/25/18 0459  NA 133* 137  K 3.3* 3.9  CL 100 104  CO2 24 25  GLUCOSE 232* 168*  BUN 18 17  CREATININE 0.91 0.96  CALCIUM 9.2 8.9  MG 1.4*  --  GFR: Estimated Creatinine Clearance: 73.3 mL/min (by C-G formula based on SCr of 0.96 mg/dL). Liver Function Tests: Recent Labs  Lab 01/24/18 1739  AST 31  ALT 35  ALKPHOS 57  BILITOT 1.1  PROT 6.7  ALBUMIN 3.1*   Recent Labs  Lab 01/24/18 1739  LIPASE 19   No results for input(s): AMMONIA in the last 168 hours. Coagulation Profile: No results for input(s): INR, PROTIME in the last 168 hours. Cardiac Enzymes: No results for input(s): CKTOTAL, CKMB, CKMBINDEX, TROPONINI in the last 168  hours. BNP (last 3 results) No results for input(s): PROBNP in the last 8760 hours. HbA1C: No results for input(s): HGBA1C in the last 72 hours. CBG: Recent Labs  Lab 01/24/18 2134 01/25/18 0809 01/25/18 1156 01/25/18 1612  GLUCAP 158* 158* 176* 139*   Lipid Profile: No results for input(s): CHOL, HDL, LDLCALC, TRIG, CHOLHDL, LDLDIRECT in the last 72 hours. Thyroid Function Tests: No results for input(s): TSH, T4TOTAL, FREET4, T3FREE, THYROIDAB in the last 72 hours. Anemia Panel: No results for input(s): VITAMINB12, FOLATE, FERRITIN, TIBC, IRON, RETICCTPCT in the last 72 hours. Sepsis Labs: No results for input(s): PROCALCITON, LATICACIDVEN in the last 168 hours.  No results found for this or any previous visit (from the past 240 hour(s)).       Radiology Studies: Dg Chest 2 View  Result Date: 01/24/2018 CLINICAL DATA:  Fever.  Bloody sputum. EXAM: CHEST - 2 VIEW COMPARISON:  None. FINDINGS: Tiny pleural effusions seen on the lateral view. The heart, hila, mediastinum, lungs, and pleura are otherwise unremarkable. IMPRESSION: Tiny pleural effusions.  No other abnormalities. Electronically Signed   By: Dorise Bullion III M.D   On: 01/24/2018 17:49   Ct Renal Stone Study  Result Date: 01/24/2018 CLINICAL DATA:  History of UTI and flank pain, initial encounter EXAM: CT ABDOMEN AND PELVIS WITHOUT CONTRAST TECHNIQUE: Multidetector CT imaging of the abdomen and pelvis was performed following the standard protocol without IV contrast. COMPARISON:  None. FINDINGS: Lower chest: Lung bases demonstrate no focal infiltrate or sizable effusion. A 5 mm subpleural nodular density is noted in the left lower lobe best seen on image number 10 of series 4. Hepatobiliary: No focal liver abnormality is seen. Status post cholecystectomy. No biliary dilatation. Pancreas: Unremarkable. No pancreatic ductal dilatation or surrounding inflammatory changes. Spleen: Normal in size without focal abnormality.  Adrenals/Urinary Tract: Adrenal glands are within normal limits bilaterally. The left kidney demonstrates a central renal pelvic stone measuring 12 mm. No obstructive changes are seen. Bladder is partially distended. Right kidney demonstrates significant perinephric stranding and appears somewhat enlarged when compared with the left. Tiny nonobstructing stone is noted in the right mid to lower pole. No significant hydronephrosis is noted although some thickening of the renal pelvis is seen. This may be related to the underlying UTI. No ureteral stone or obstructive changes are noted. Stomach/Bowel: No obstructive or inflammatory changes of the bowel are seen. The appendix is within normal limits. Laxity of the anterior abdominal wall is seen with large panniculus anteriorly. Vascular/Lymphatic: No significant vascular findings are present. No enlarged abdominal or pelvic lymph nodes. Reproductive: Uterus and bilateral adnexa are unremarkable. Other: No abdominal wall hernia or abnormality. No abdominopelvic ascites. Musculoskeletal: No acute or significant osseous findings. Right hip replacement is noted. IMPRESSION: 12 mm nonobstructing left renal pelvic stone. Tiny nonobstructing right renal stone. Perinephric stranding as well as some enlargement of the right kidney and thickening of Gerota's fascia. This may be related to changes  from recently passed stone. Possibility of pyelonephritis deserves consideration. Left lower lobe 5 mm subpleural nodule. No follow-up needed if patient is low-risk. Non-contrast chest CT can be considered in 12 months if patient is high-risk. This recommendation follows the consensus statement: Guidelines for Management of Incidental Pulmonary Nodules Detected on CT Images: From the Fleischner Society 2017; Radiology 2017; 284:228-243. Electronically Signed   By: Inez Catalina M.D.   On: 01/24/2018 19:45        Scheduled Meds: . amLODipine  5 mg Oral Q supper  . atorvastatin   10 mg Oral Q supper  . enoxaparin (LOVENOX) injection  70 mg Subcutaneous Q24H  . fenofibrate  160 mg Oral Q lunch  . guaiFENesin  1,200 mg Oral BID  . insulin aspart  0-9 Units Subcutaneous TID WC  . metFORMIN  1,000 mg Oral BID WC   And  . metFORMIN  500 mg Oral Q lunch  . venlafaxine XR  75 mg Oral Q breakfast   Continuous Infusions: . cefTRIAXone (ROCEPHIN)  IV 2 g (01/25/18 0459)     LOS: 1 day    Time spent: 35 minutes    Kathie Dike, MD [facility] Pager [phone]  If 7PM-7AM, please contact night-coverage [email or web address] Password [facility] 01/25/2018, 7:24 PM

## 2018-01-26 LAB — GLUCOSE, CAPILLARY
Glucose-Capillary: 148 mg/dL — ABNORMAL HIGH (ref 70–99)
Glucose-Capillary: 148 mg/dL — ABNORMAL HIGH (ref 70–99)
Glucose-Capillary: 142 mg/dL — ABNORMAL HIGH (ref 70–99)
Glucose-Capillary: 181 mg/dL — ABNORMAL HIGH (ref 70–99)

## 2018-01-26 LAB — CBC
HEMATOCRIT: 32.4 % — AB (ref 36.0–46.0)
Hemoglobin: 9.5 g/dL — ABNORMAL LOW (ref 12.0–15.0)
MCH: 23.6 pg — AB (ref 26.0–34.0)
MCHC: 29.3 g/dL — AB (ref 30.0–36.0)
MCV: 80.6 fL (ref 80.0–100.0)
Platelets: 278 10*3/uL (ref 150–400)
RBC: 4.02 MIL/uL (ref 3.87–5.11)
RDW: 26.4 % — AB (ref 11.5–15.5)
WBC: 13.7 10*3/uL — ABNORMAL HIGH (ref 4.0–10.5)
nRBC: 0 % (ref 0.0–0.2)

## 2018-01-26 LAB — HIV ANTIBODY (ROUTINE TESTING W REFLEX): HIV Screen 4th Generation wRfx: NONREACTIVE

## 2018-01-26 MED ORDER — SODIUM CHLORIDE 0.9 % IV SOLN
1.0000 g | Freq: Three times a day (TID) | INTRAVENOUS | Status: DC
  Administered 2018-01-26 – 2018-01-27 (×3): 1 g via INTRAVENOUS
  Filled 2018-01-26 (×7): qty 1

## 2018-01-26 MED ORDER — LACTATED RINGERS IV SOLN
INTRAVENOUS | Status: DC
  Administered 2018-01-26 – 2018-01-27 (×2): via INTRAVENOUS

## 2018-01-26 MED ORDER — ENOXAPARIN SODIUM 60 MG/0.6ML ~~LOC~~ SOLN
60.0000 mg | SUBCUTANEOUS | Status: DC
  Administered 2018-01-26: 60 mg via SUBCUTANEOUS
  Filled 2018-01-26: qty 0.6

## 2018-01-26 NOTE — Progress Notes (Signed)
Urine culture results requested from patient's urology office [facility] in [place]. Faxed report noted on fax machine this evening, given to Dr. Roderic Palau. Donavan Foil, RN

## 2018-01-26 NOTE — Progress Notes (Signed)
Lactated ringers off schedule due to IV maintenance and IV antibiotic not being compatible. Notified Dr. Roderic Palau, stated okay. Lactated ringers to start once IV antibiotic infusion complete. Donavan Foil, RN

## 2018-01-26 NOTE — Progress Notes (Signed)
Pharmacy Antibiotic Note  Felicia Hogan is a 71 y.o. female admitted on 01/24/2018 with UTI.  Pharmacy has been consulted for meropenem dosing.  Plan: Meropenem 1000 mg IV every 8 hours  Monitor labs, c/s, and patient improvement.  Height: 5\' 5"  (165.1 cm) Weight: 281 lb 4.9 oz (127.6 kg) IBW/kg (Calculated) : 57  Temp (24hrs), Avg:98.8 F (37.1 C), Min:97.7 F (36.5 C), Max:100.8 F (38.2 C)  Recent Labs  Lab 01/24/18 1739 01/25/18 0459 01/26/18 0449  WBC 11.7* 11.4* 13.7*  CREATININE 0.91 0.96  --     Estimated Creatinine Clearance: 73.3 mL/min (by C-G formula based on SCr of 0.96 mg/dL).    Allergies  Allergen Reactions  . Nsaids Itching    If given in large doses.   . Erythromycin Hives and Rash  . Sulfa Antibiotics Rash  . Tape Itching and Rash    Antimicrobials this admission: Merrem 11/5 >>  Ceftriaxone 11/4 >> 11/5  Dose adjustments this admission: N/A  Microbiology results: 11/5 UCx: gram negative rods   Thank you for allowing pharmacy to be a part of this patient's care.  Ramond Craver 01/26/2018 12:08 PM

## 2018-01-26 NOTE — Progress Notes (Addendum)
PROGRESS NOTE    Felicia Hogan  VZD:[id number] DOB: December 27, 1945 DOA: 01/24/2018 PCP: Earney Mallet, MD    Brief Narrative:  72 year old female with a history of hypertension diabetes, kidney stones, who presented with dysuria, right-sided abdominal pain and fevers.  She was recently seen her urologist office and was told she had a urinary tract infection.  She completed a course of Macrobid, but her symptoms persisted.  She came to the hospital for evaluation where he analysis indicated persistent infection she was noted to be febrile.  She was admitted for further treatments.   Assessment & Plan:   Principal Problem:   Acute pyelonephritis Active Problems:   DM (diabetes mellitus) ([place])   Essential hypertension   Acute frontal sinusitis   1. Acute pyelonephritis.  Fevers have persisted overnight despite being on ceftriaxone.  CT scan indicated left-sided perinephric stranding.  Urine culture shows 60,000 colonies of gram-negative rods, likely partially treated UTI.  She is continued to have fevers, will transition to meropenem for now until final urine culture results are available.  Records from primary urologist office were reviewed and urine culture shows presumptive Klebsiella with resistance to ampicillin and cefazolin.  Continue current treatments until final culture results in our system are available.. 2. Subpleural nodule.  Incidental finding on CT study.  Will need noncontrast chest CT in the next 12 months for surveillance. 3. Diabetes.  Blood sugars have been stable.  Continue on metformin and sliding scale insulin. 4. Hypertension.  Blood pressure currently stable on amlodipine. 5. Hyperlipidemia.  Continue statin   DVT prophylaxis: Lovenox Code Status: Full code Family Communication: Discussed with husband at the bedside Disposition Plan: Discharge home once improved   Consultants:     Procedures:     Antimicrobials:   Ceftriaxone 11/3 >11/5  Meropenem  11/5 >   Subjective: Still has some headache, but overall nasal congestion is better.  No abdominal pain/flank pain.  Continues to have some fevers overnight.  Objective: Vitals:   01/26/18 0635 01/26/18 1048 01/26/18 1445 01/26/18 1517  BP: (!) 112/41  (!) 126/59   Pulse: 83  84 80  Resp:   20   Temp: (!) 100.8 F (38.2 C) 97.7 F (36.5 C) 98.5 F (36.9 C)   TempSrc: Oral Oral    SpO2:   (!) 82% 96%  Weight:      Height:        Intake/Output Summary (Last 24 hours) at 01/26/2018 1802 Last data filed at 01/26/2018 1800 Gross per 24 hour  Intake 822.87 ml  Output 1450 ml  Net -627.13 ml   Filed Weights   01/24/18 1659 01/24/18 2128  Weight: 129.7 kg 127.6 kg    Examination:  General exam: Alert, awake, oriented x 3 Respiratory system: Clear to auscultation. Respiratory effort normal. Cardiovascular system:RRR. No murmurs, rubs, gallops. Gastrointestinal system: Abdomen is nondistended, soft and nontender. No organomegaly or masses felt. Normal bowel sounds heard. Central nervous system: Alert and oriented. No focal neurological deficits. Extremities: No C/C/E, +pedal pulses Skin: No rashes, lesions or ulcers Psychiatry: Judgement and insight appear normal. Mood & affect appropriate.    Data Reviewed: I have personally reviewed following labs and imaging studies  CBC: Recent Labs  Lab 01/24/18 1739 01/25/18 0459 01/26/18 0449  WBC 11.7* 11.4* 13.7*  NEUTROABS 9.3*  --   --   HGB 10.4* 9.7* 9.5*  HCT 34.2* 31.5* 32.4*  MCV 78.4* 80.2 80.6  PLT 249 240 295   Basic Metabolic Panel: Recent  Labs  Lab 01/24/18 1739 01/25/18 0459  NA 133* 137  K 3.3* 3.9  CL 100 104  CO2 24 25  GLUCOSE 232* 168*  BUN 18 17  CREATININE 0.91 0.96  CALCIUM 9.2 8.9  MG 1.4*  --    GFR: Estimated Creatinine Clearance: 73.3 mL/min (by C-G formula based on SCr of 0.96 mg/dL). Liver Function Tests: Recent Labs  Lab 01/24/18 1739  AST 31  ALT 35  ALKPHOS 57  BILITOT  1.1  PROT 6.7  ALBUMIN 3.1*   Recent Labs  Lab 01/24/18 1739  LIPASE 19   No results for input(s): AMMONIA in the last 168 hours. Coagulation Profile: No results for input(s): INR, PROTIME in the last 168 hours. Cardiac Enzymes: No results for input(s): CKTOTAL, CKMB, CKMBINDEX, TROPONINI in the last 168 hours. BNP (last 3 results) No results for input(s): PROBNP in the last 8760 hours. HbA1C: No results for input(s): HGBA1C in the last 72 hours. CBG: Recent Labs  Lab 01/25/18 1612 01/25/18 2151 01/26/18 0744 01/26/18 1114 01/26/18 1626  GLUCAP 139* 135* 148* 148* 142*   Lipid Profile: No results for input(s): CHOL, HDL, LDLCALC, TRIG, CHOLHDL, LDLDIRECT in the last 72 hours. Thyroid Function Tests: No results for input(s): TSH, T4TOTAL, FREET4, T3FREE, THYROIDAB in the last 72 hours. Anemia Panel: No results for input(s): VITAMINB12, FOLATE, FERRITIN, TIBC, IRON, RETICCTPCT in the last 72 hours. Sepsis Labs: No results for input(s): PROCALCITON, LATICACIDVEN in the last 168 hours.  Recent Results (from the past 240 hour(s))  Culture, Urine     Status: Abnormal (Preliminary result)   Collection Time: 01/24/18  6:20 PM  Result Value Ref Range Status   Specimen Description   Final    URINE, CLEAN CATCH Performed at [facility], [address] [place], [place] [address]    Special Requests   Final    NONE Performed at [facility], [address] [place], [place] [address]    Culture 60,000 COLONIES/mL ENTEROBACTER AEROGENES (A)  Final   Report Status PENDING  Incomplete         Radiology Studies: Ct Renal Stone Study  Result Date: 01/24/2018 CLINICAL DATA:  History of UTI and flank pain, initial encounter EXAM: CT ABDOMEN AND PELVIS WITHOUT CONTRAST TECHNIQUE: Multidetector CT imaging of the abdomen and pelvis was performed following the standard protocol without IV contrast. COMPARISON:  None. FINDINGS: Lower chest: Lung bases demonstrate no focal  infiltrate or sizable effusion. A 5 mm subpleural nodular density is noted in the left lower lobe best seen on image number 10 of series 4. Hepatobiliary: No focal liver abnormality is seen. Status post cholecystectomy. No biliary dilatation. Pancreas: Unremarkable. No pancreatic ductal dilatation or surrounding inflammatory changes. Spleen: Normal in size without focal abnormality. Adrenals/Urinary Tract: Adrenal glands are within normal limits bilaterally. The left kidney demonstrates a central renal pelvic stone measuring 12 mm. No obstructive changes are seen. Bladder is partially distended. Right kidney demonstrates significant perinephric stranding and appears somewhat enlarged when compared with the left. Tiny nonobstructing stone is noted in the right mid to lower pole. No significant hydronephrosis is noted although some thickening of the renal pelvis is seen. This may be related to the underlying UTI. No ureteral stone or obstructive changes are noted. Stomach/Bowel: No obstructive or inflammatory changes of the bowel are seen. The appendix is within normal limits. Laxity of the anterior abdominal wall is seen with large panniculus anteriorly. Vascular/Lymphatic: No significant vascular findings are present. No enlarged abdominal  or pelvic lymph nodes. Reproductive: Uterus and bilateral adnexa are unremarkable. Other: No abdominal wall hernia or abnormality. No abdominopelvic ascites. Musculoskeletal: No acute or significant osseous findings. Right hip replacement is noted. IMPRESSION: 12 mm nonobstructing left renal pelvic stone. Tiny nonobstructing right renal stone. Perinephric stranding as well as some enlargement of the right kidney and thickening of Gerota's fascia. This may be related to changes from recently passed stone. Possibility of pyelonephritis deserves consideration. Left lower lobe 5 mm subpleural nodule. No follow-up needed if patient is low-risk. Non-contrast chest CT can be considered  in 12 months if patient is high-risk. This recommendation follows the consensus statement: Guidelines for Management of Incidental Pulmonary Nodules Detected on CT Images: From the Fleischner Society 2017; Radiology 2017; 284:228-243. Electronically Signed   By: Inez Catalina M.D.   On: 01/24/2018 19:45        Scheduled Meds: . amLODipine  5 mg Oral Q supper  . atorvastatin  10 mg Oral Q supper  . enoxaparin (LOVENOX) injection  60 mg Subcutaneous Q24H  . fenofibrate  160 mg Oral Q lunch  . guaiFENesin  1,200 mg Oral BID  . insulin aspart  0-9 Units Subcutaneous TID WC  . metFORMIN  1,000 mg Oral BID WC   And  . metFORMIN  500 mg Oral Q lunch  . venlafaxine XR  75 mg Oral Q breakfast   Continuous Infusions: . lactated ringers    . meropenem (MERREM) IV 200 mL/hr at 01/26/18 1800     LOS: 2 days    Time spent: 25 minutes    Kathie Dike, MD [facility] Pager [phone]  If 7PM-7AM, please contact night-coverage [email or web address] Password [email or web address] 01/26/2018, 6:02 PM

## 2018-01-27 LAB — CBC
HEMATOCRIT: 29.6 % — AB (ref 36.0–46.0)
Hemoglobin: 8.9 g/dL — ABNORMAL LOW (ref 12.0–15.0)
MCH: 24.3 pg — ABNORMAL LOW (ref 26.0–34.0)
MCHC: 30.1 g/dL (ref 30.0–36.0)
MCV: 80.9 fL (ref 80.0–100.0)
NRBC: 0 % (ref 0.0–0.2)
PLATELETS: 336 10*3/uL (ref 150–400)
RBC: 3.66 MIL/uL — ABNORMAL LOW (ref 3.87–5.11)
RDW: 25.8 % — ABNORMAL HIGH (ref 11.5–15.5)
WBC: 13.6 10*3/uL — AB (ref 4.0–10.5)

## 2018-01-27 LAB — URINE CULTURE: Culture: 60000 — AB

## 2018-01-27 LAB — GLUCOSE, CAPILLARY
GLUCOSE-CAPILLARY: 127 mg/dL — AB (ref 70–99)
GLUCOSE-CAPILLARY: 121 mg/dL — AB (ref 70–99)

## 2018-01-27 MED ORDER — CIPROFLOXACIN HCL 500 MG PO TABS: 500.0000 mg | ORAL_TABLET | Freq: Two times a day (BID) | ORAL | 0 refills | Status: AC

## 2018-01-27 NOTE — Discharge Summary (Signed)
Physician Discharge Summary  Felicia Hogan CBJ:[id number] DOB: 04-15-1945 DOA: 01/24/2018  PCP: Earney Mallet, MD  Admit date: 01/24/2018 Discharge date: 01/27/2018  Admitted From: home Disposition:  home  Recommendations for Outpatient Follow-up:  1. Follow up with PCP in 1-2 weeks 2. Please obtain CBC in one week to ensure resolution of leukocytosis 3. Repeat noncontrast chest CT in 12 months for surveillance on subpleural nodule  Discharge Condition: stable CODE STATUS:full code Diet recommendation: heart healthy, carb modified  Brief/Interim Summary: 72 y/o female with history of hypertension, diabetes, kidney stones, who presents to the hospital with abdominal pain and fevers.  She recently seen her urologist and was told that she had a urinary tract infection.  Cultures from urologist office grew Klebsiella.  She was prescribed a course of Macrobid.  Despite taking antibiotics, her symptoms persisted.  She continued to have fevers.  She came to the hospital for evaluation where urinalysis indicated persistent infection.  She was also noted to be febrile.  She was admitted for intravenous antibiotic therapy.  She was started on ceftriaxone.  Urine culture positive for Enterobacter which was resistant to Macrobid.  She was continued on IV antibiotic therapies and has defervesced.  She is feeling significantly better.  Per urine culture sensitivities, she is been transitioned to oral ciprofloxacin to complete her course.  She will need a repeat CBC in 1 week to ensure resolution of leukocytosis.  She is been encouraged to continue hydration with oral fluids.  She did also complains of frontal headache and nasal discharge.  She was found to have frontal sinusitis.  She is being treated supportively with mucolytic's and NSAIDs.  Patient is feeling significantly better.  She feels ready for discharge home.  Discharge Diagnoses:  Principal Problem:   Acute pyelonephritis Active Problems:   DM  (diabetes mellitus) ([place])   Essential hypertension   Acute frontal sinusitis    Discharge Instructions  Discharge Instructions    Diet - low sodium heart healthy   Complete by:  As directed    Increase activity slowly   Complete by:  As directed      Allergies as of 01/27/2018      Reactions   Nsaids Itching   If given in large doses.    Erythromycin Hives, Rash   Sulfa Antibiotics Rash   Tape Itching, Rash      Medication List    TAKE these medications   acetaminophen 500 MG tablet Commonly known as:  TYLENOL Take 1,000 mg by mouth 3 (three) times daily as needed for moderate pain or headache.   amLODipine 5 MG tablet Commonly known as:  NORVASC Take 5 mg by mouth daily with supper.   atorvastatin 20 MG tablet Commonly known as:  LIPITOR Take 10 mg by mouth daily with supper.   BENICAR HCT 20-12.5 MG tablet Generic drug:  olmesartan-hydrochlorothiazide Take 1 tablet by mouth daily.   cetirizine 10 MG tablet Commonly known as:  ZYRTEC Take 10 mg by mouth at bedtime as needed for allergies.   ciprofloxacin 500 MG tablet Commonly known as:  CIPRO Take 1 tablet (500 mg total) by mouth 2 (two) times daily for 4 days.   desvenlafaxine 50 MG 24 hr tablet Commonly known as:  PRISTIQ Take 50 mg by mouth daily.   dexamethasone 0.5 MG/5ML solution Commonly known as:  DECADRON Take 5 mLs by mouth 2 (two) times daily as needed (oral sores).   fenofibrate 160 MG tablet Take 160 mg by mouth  daily with lunch.   Fish Oil 1200 MG Caps Take 1,200 mg by mouth 3 (three) times daily.   fluocinonide gel 0.05 % Commonly known as:  LIDEX 1 application See admin instructions. Apply orally (dont swallow) 3 times daily as needed for mouth sores   ibuprofen 200 MG tablet Commonly known as:  ADVIL,MOTRIN Take 400 mg by mouth 3 (three) times daily as needed for headache or moderate pain.   metFORMIN 1000 MG tablet Commonly known as:  GLUCOPHAGE Take 500-1,000 mg by mouth  See admin instructions. Take 1000 mg with breakfast, 500 mg with lunch, and 1000 mg with dinner   multivitamin tablet Take 1 tablet by mouth daily with lunch.   omeprazole 20 MG capsule Commonly known as:  PRILOSEC 1 PO 30 MINS PRIOR TO BREAKFAST.   PRESERVISION AREDS PO Take 1 capsule by mouth 2 (two) times daily.   ranitidine 150 MG tablet Commonly known as:  ZANTAC Take 150 mg by mouth at bedtime as needed for heartburn.   SYSTANE ULTRA OP Place 1 drop into both eyes 4 (four) times daily as needed (dry eyes).   tamsulosin 0.4 MG Caps capsule Commonly known as:  FLOMAX Take 0.4 mg by mouth daily as needed (kidney stone).   Vitamin D3 125 MCG (5000 UT) Tabs Take 5,000 Units by mouth daily with supper.       Allergies  Allergen Reactions  . Nsaids Itching    If given in large doses.   . Erythromycin Hives and Rash  . Sulfa Antibiotics Rash  . Tape Itching and Rash    Consultations:     Procedures/Studies: Dg Chest 2 View  Result Date: 01/24/2018 CLINICAL DATA:  Fever.  Bloody sputum. EXAM: CHEST - 2 VIEW COMPARISON:  None. FINDINGS: Tiny pleural effusions seen on the lateral view. The heart, hila, mediastinum, lungs, and pleura are otherwise unremarkable. IMPRESSION: Tiny pleural effusions.  No other abnormalities. Electronically Signed   By: Dorise Bullion III M.D   On: 01/24/2018 17:49   Ct Renal Stone Study  Result Date: 01/24/2018 CLINICAL DATA:  History of UTI and flank pain, initial encounter EXAM: CT ABDOMEN AND PELVIS WITHOUT CONTRAST TECHNIQUE: Multidetector CT imaging of the abdomen and pelvis was performed following the standard protocol without IV contrast. COMPARISON:  None. FINDINGS: Lower chest: Lung bases demonstrate no focal infiltrate or sizable effusion. A 5 mm subpleural nodular density is noted in the left lower lobe best seen on image number 10 of series 4. Hepatobiliary: No focal liver abnormality is seen. Status post cholecystectomy. No  biliary dilatation. Pancreas: Unremarkable. No pancreatic ductal dilatation or surrounding inflammatory changes. Spleen: Normal in size without focal abnormality. Adrenals/Urinary Tract: Adrenal glands are within normal limits bilaterally. The left kidney demonstrates a central renal pelvic stone measuring 12 mm. No obstructive changes are seen. Bladder is partially distended. Right kidney demonstrates significant perinephric stranding and appears somewhat enlarged when compared with the left. Tiny nonobstructing stone is noted in the right mid to lower pole. No significant hydronephrosis is noted although some thickening of the renal pelvis is seen. This may be related to the underlying UTI. No ureteral stone or obstructive changes are noted. Stomach/Bowel: No obstructive or inflammatory changes of the bowel are seen. The appendix is within normal limits. Laxity of the anterior abdominal wall is seen with large panniculus anteriorly. Vascular/Lymphatic: No significant vascular findings are present. No enlarged abdominal or pelvic lymph nodes. Reproductive: Uterus and bilateral adnexa are unremarkable. Other: No abdominal  wall hernia or abnormality. No abdominopelvic ascites. Musculoskeletal: No acute or significant osseous findings. Right hip replacement is noted. IMPRESSION: 12 mm nonobstructing left renal pelvic stone. Tiny nonobstructing right renal stone. Perinephric stranding as well as some enlargement of the right kidney and thickening of Gerota's fascia. This may be related to changes from recently passed stone. Possibility of pyelonephritis deserves consideration. Left lower lobe 5 mm subpleural nodule. No follow-up needed if patient is low-risk. Non-contrast chest CT can be considered in 12 months if patient is high-risk. This recommendation follows the consensus statement: Guidelines for Management of Incidental Pulmonary Nodules Detected on CT Images: From the Fleischner Society 2017; Radiology 2017;  284:228-243. Electronically Signed   By: Inez Catalina M.D.   On: 01/24/2018 19:45       Subjective: Feeling better. No fevers overnight. Nasal congestion is better. No abdominal pain  Discharge Exam: Vitals:   01/26/18 1517 01/26/18 2058 01/27/18 0529 01/27/18 0540  BP:  121/83 134/72 (!) 119/58  Pulse: 80 95 92 78  Resp:  19 18 19   Temp:  99.8 F (37.7 C) 98.2 F (36.8 C) 98.7 F (37.1 C)  TempSrc:  Oral Oral Oral  SpO2: 96% 93% 93%   Weight:      Height:        General: Pt is alert, awake, not in acute distress Cardiovascular: RRR, S1/S2 +, no rubs, no gallops Respiratory: CTA bilaterally, no wheezing, no rhonchi Abdominal: Soft, NT, ND, bowel sounds + Extremities: no edema, no cyanosis    The results of significant diagnostics from this hospitalization (including imaging, microbiology, ancillary and laboratory) are listed below for reference.     Microbiology: Recent Results (from the past 240 hour(s))  Culture, Urine     Status: Abnormal   Collection Time: 01/24/18  6:20 PM  Result Value Ref Range Status   Specimen Description   Final    URINE, CLEAN CATCH Performed at [facility], [address] [place], [place] [address]    Special Requests   Final    NONE Performed at [facility], [address], [place], [place] [address]    Culture 60,000 COLONIES/mL ENTEROBACTER AEROGENES (A)  Final   Report Status 01/27/2018 FINAL  Final   Organism ID, Bacteria ENTEROBACTER AEROGENES (A)  Final      Susceptibility   Enterobacter aerogenes - MIC*    CEFAZOLIN RESISTANT Resistant     CEFTRIAXONE <=1 SENSITIVE Sensitive     CIPROFLOXACIN <=0.25 SENSITIVE Sensitive     GENTAMICIN <=1 SENSITIVE Sensitive     IMIPENEM 1 SENSITIVE Sensitive     NITROFURANTOIN 128 RESISTANT Resistant     TRIMETH/SULFA <=20 SENSITIVE Sensitive     PIP/TAZO 8 SENSITIVE Sensitive     * 60,000 COLONIES/mL ENTEROBACTER AEROGENES     Labs: BNP (last 3 results) No results for input(s):  BNP in the last 8760 hours. Basic Metabolic Panel: Recent Labs  Lab 01/24/18 1739 01/25/18 0459  NA 133* 137  K 3.3* 3.9  CL 100 104  CO2 24 25  GLUCOSE 232* 168*  BUN 18 17  CREATININE 0.91 0.96  CALCIUM 9.2 8.9  MG 1.4*  --    Liver Function Tests: Recent Labs  Lab 01/24/18 1739  AST 31  ALT 35  ALKPHOS 57  BILITOT 1.1  PROT 6.7  ALBUMIN 3.1*   Recent Labs  Lab 01/24/18 1739  LIPASE 19   No results for input(s): AMMONIA in the last 168 hours. CBC: Recent Labs  Lab 01/24/18 1739 01/25/18 0459 01/26/18 0449 01/27/18 0437  WBC 11.7* 11.4* 13.7* 13.6*  NEUTROABS 9.3*  --   --   --   HGB 10.4* 9.7* 9.5* 8.9*  HCT 34.2* 31.5* 32.4* 29.6*  MCV 78.4* 80.2 80.6 80.9  PLT 249 240 278 336   Cardiac Enzymes: No results for input(s): CKTOTAL, CKMB, CKMBINDEX, TROPONINI in the last 168 hours. BNP: Invalid input(s): POCBNP CBG: Recent Labs  Lab 01/26/18 1114 01/26/18 1626 01/26/18 2055 01/27/18 0737 01/27/18 1127  GLUCAP 148* 142* 181* 127* 121*   D-Dimer No results for input(s): DDIMER in the last 72 hours. Hgb A1c No results for input(s): HGBA1C in the last 72 hours. Lipid Profile No results for input(s): CHOL, HDL, LDLCALC, TRIG, CHOLHDL, LDLDIRECT in the last 72 hours. Thyroid function studies No results for input(s): TSH, T4TOTAL, T3FREE, THYROIDAB in the last 72 hours.  Invalid input(s): FREET3 Anemia work up No results for input(s): VITAMINB12, FOLATE, FERRITIN, TIBC, IRON, RETICCTPCT in the last 72 hours. Urinalysis    Component Value Date/Time   COLORURINE AMBER (A) 01/24/2018 1820   APPEARANCEUR HAZY (A) 01/24/2018 1820   LABSPEC 1.015 01/24/2018 1820   PHURINE 5.0 01/24/2018 1820   GLUCOSEU 50 (A) 01/24/2018 1820   HGBUR MODERATE (A) 01/24/2018 1820   BILIRUBINUR NEGATIVE 01/24/2018 1820   KETONESUR 5 (A) 01/24/2018 1820   PROTEINUR 100 (A) 01/24/2018 1820   NITRITE NEGATIVE 01/24/2018 1820   LEUKOCYTESUR MODERATE (A) 01/24/2018 1820    Sepsis Labs Invalid input(s): PROCALCITONIN,  WBC,  LACTICIDVEN Microbiology Recent Results (from the past 240 hour(s))  Culture, Urine     Status: Abnormal   Collection Time: 01/24/18  6:20 PM  Result Value Ref Range Status   Specimen Description   Final    URINE, CLEAN CATCH Performed at [facility], [address] [place], [place] [address]    Special Requests   Final    NONE Performed at [facility], [address], [place], [place] [address]    Culture 60,000 COLONIES/mL ENTEROBACTER AEROGENES (A)  Final   Report Status 01/27/2018 FINAL  Final   Organism ID, Bacteria ENTEROBACTER AEROGENES (A)  Final      Susceptibility   Enterobacter aerogenes - MIC*    CEFAZOLIN RESISTANT Resistant     CEFTRIAXONE <=1 SENSITIVE Sensitive     CIPROFLOXACIN <=0.25 SENSITIVE Sensitive     GENTAMICIN <=1 SENSITIVE Sensitive     IMIPENEM 1 SENSITIVE Sensitive     NITROFURANTOIN 128 RESISTANT Resistant     TRIMETH/SULFA <=20 SENSITIVE Sensitive     PIP/TAZO 8 SENSITIVE Sensitive     * 60,000 COLONIES/mL ENTEROBACTER AEROGENES     Time coordinating discharge: 20mins  SIGNED:   Kathie Dike, MD  [facility] 01/27/2018, 12:13 PM Pager   If 7PM-7AM, please contact night-coverage [email or web address] Password [email or web address]

## 2018-01-27 NOTE — Progress Notes (Signed)
Patient discharged home today per MD orders. Patient vital signs WDL. IV removed and site WDL. Discharge Instructions including follow up appointments, medications, and education reviewed with patient. Patient verbalizes understanding. Patient is transported out via wheelchair.  

## 2018-01-27 NOTE — Care Management Important Message (Signed)
Important Message  Patient Details  Name: Felicia Hogan MRN: [id number] Date of Birth: December 30, 1945   Medicare Important Message Given:  Yes    Shelda Altes 01/27/2018, 12:34 PM

## 2018-02-03 ENCOUNTER — Other Ambulatory Visit: Payer: Self-pay

## 2018-02-03 DIAGNOSIS — D509 Iron deficiency anemia, unspecified: Secondary | ICD-10-CM

## 2018-03-02 ENCOUNTER — Telehealth: Payer: Self-pay | Admitting: Gastroenterology

## 2018-03-02 NOTE — Telephone Encounter (Signed)
FYI to Dr. Fields.  

## 2018-03-02 NOTE — Telephone Encounter (Signed)
Patient said she is due for labs here and she is going to have them also drawn at her pcp's office and she will have them fax a copy here.

## 2018-03-02 NOTE — Telephone Encounter (Signed)
PLEASE CALL FAX ORDER FOR FERRITIN AND CBC TO [facility].

## 2018-03-03 NOTE — Telephone Encounter (Signed)
Faxed

## 2018-03-19 ENCOUNTER — Ambulatory Visit: Payer: Medicare Other | Admitting: Nurse Practitioner

## 2018-03-19 ENCOUNTER — Ambulatory Visit: Payer: Medicare Other | Admitting: Gastroenterology

## 2018-03-23 ENCOUNTER — Ambulatory Visit: Payer: Medicare Other | Admitting: Gastroenterology

## 2018-04-20 ENCOUNTER — Encounter: Payer: Self-pay | Admitting: Gastroenterology

## 2018-05-26 ENCOUNTER — Ambulatory Visit: Payer: Medicare Other | Admitting: Gastroenterology

## 2018-07-15 ENCOUNTER — Ambulatory Visit: Payer: Medicare Other | Admitting: Gastroenterology

## 2019-12-22 IMAGING — DX DG CHEST 2V
2 series · 2 of 2 positions shown · non-contrast
Comparison: None.

CLINICAL DATA: Fever.  Bloody sputum.

EXAM:
CHEST - 2 VIEW

[chest pa]
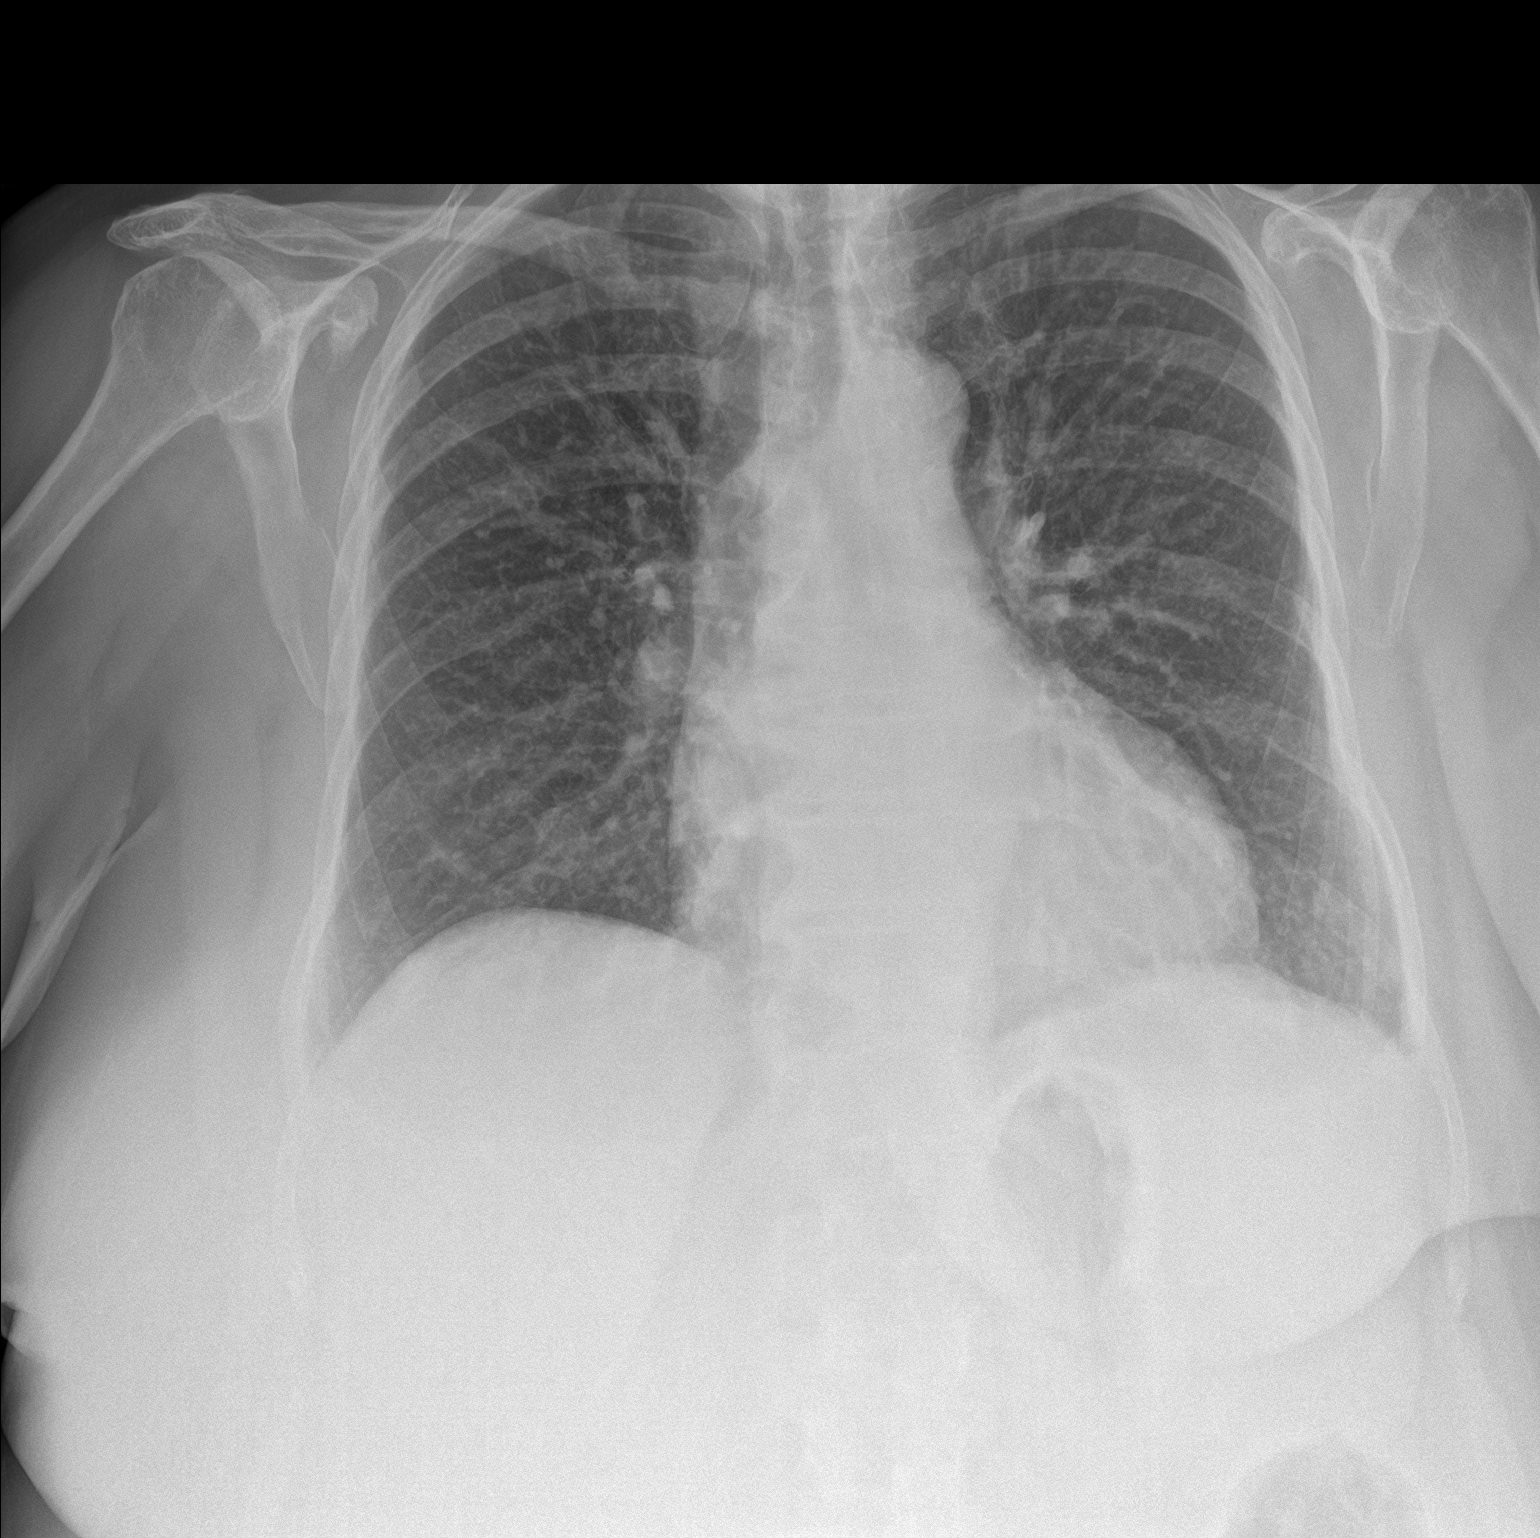

[chest lat]
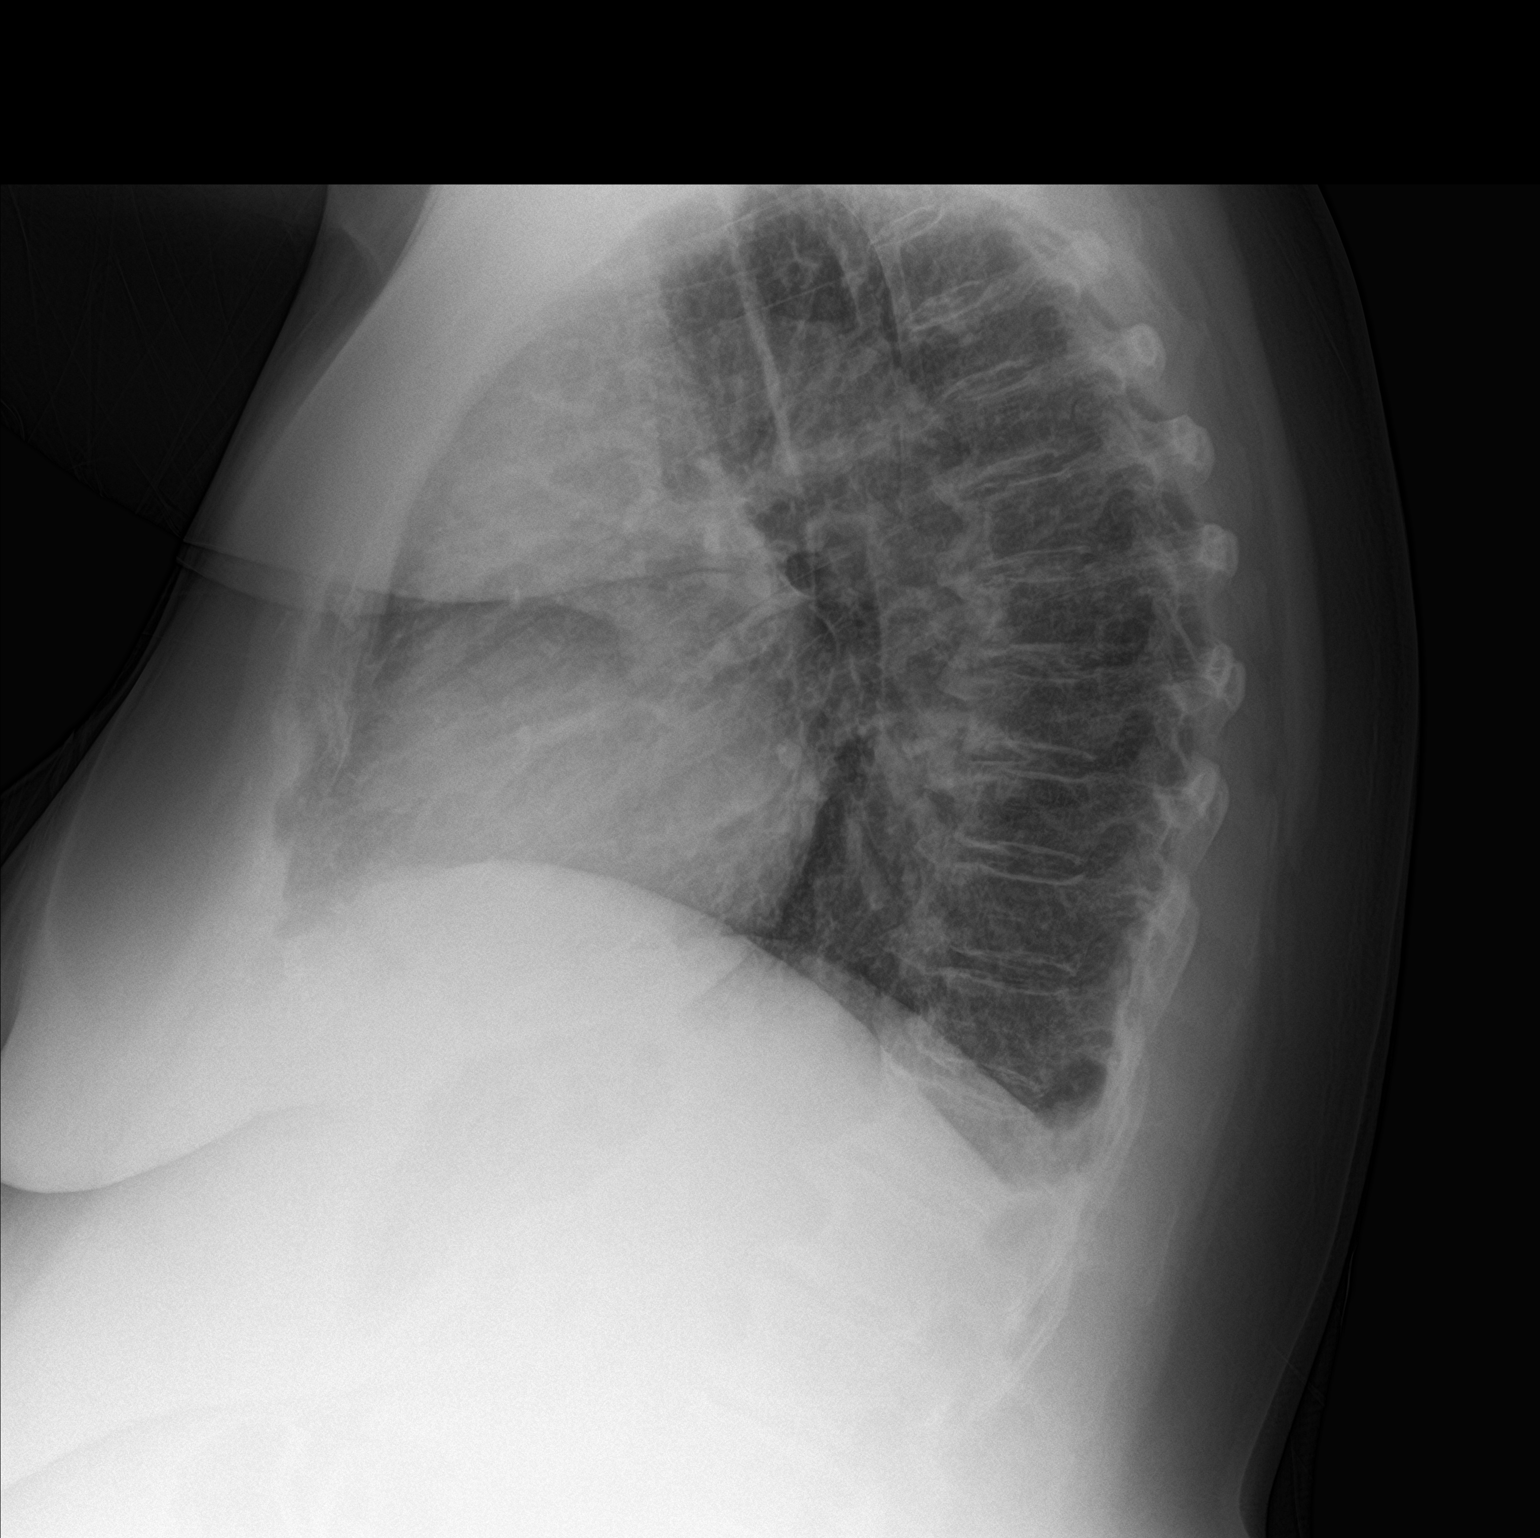

[2 of 2 positions shown; findings below may reference images not displayed]

FINDINGS: Tiny pleural effusions seen on the lateral view. The heart, hila,
mediastinum, lungs, and pleura are otherwise unremarkable.
IMPRESSION: Tiny pleural effusions.  No other abnormalities.

## 2022-01-10 ENCOUNTER — Ambulatory Visit (INDEPENDENT_AMBULATORY_CARE_PROVIDER_SITE_OTHER): Payer: Medicare Other | Admitting: Urology

## 2022-01-10 ENCOUNTER — Encounter: Payer: Self-pay | Admitting: Urology

## 2022-01-10 ENCOUNTER — Ambulatory Visit (HOSPITAL_COMMUNITY)
Admission: RE | Admit: 2022-01-10 | Discharge: 2022-01-10 | Disposition: A | Discharge status: Home / Self Care | Payer: Medicare Other | Source: Ambulatory Visit | Attending: Urology | Admitting: Urology

## 2022-01-10 VITALS — BP 121/75 | HR 84

## 2022-01-10 DIAGNOSIS — N200 Calculus of kidney: Secondary | ICD-10-CM | POA: Insufficient documentation

## 2022-01-10 LAB — MICROSCOPIC EXAMINATION
RBC, Urine: >30 /hpf — AB (ref 0–2)
WBC, UA: >30 /hpf — AB (ref 0–5)

## 2022-01-10 LAB — URINALYSIS, ROUTINE W REFLEX MICROSCOPIC
Bilirubin, UA: NEGATIVE
Glucose, UA: NEGATIVE
Ketones, UA: NEGATIVE
Nitrite, UA: NEGATIVE
Specific Gravity, UA: 1.01 (ref 1.005–1.030)
Urobilinogen, Ur: 0.2 mg/dL (ref 0.2–1.0)
pH, UA: 5.5 (ref 5.0–7.5)

## 2022-01-10 MED ORDER — INDAPAMIDE 2.5 MG PO TABS: 2.5000 mg | ORAL_TABLET | Freq: Every day | ORAL | 11 refills | Status: DC

## 2022-01-10 NOTE — Patient Instructions (Signed)

## 2022-01-10 NOTE — Progress Notes (Signed)
01/10/2022 11:13 AM   [name] November 03, 1945 [id number]  Referring provider: Earney Mallet, MD [address] [place] [address]  nephrolithiasis   HPI: Ms Felicia Hogan is a 76yo here for evaluation of nephrolithiasis. She has a history of hypercalcemia and has been evaluated for hyperparathyroidism. She has ESWL in 2017 and 2020.  She has had ESWL twice this year. She has bilateral renal calculi but no recent imaging. She has been treated for a UTI  6 weeks ago treated with macrobid and then cipro. No flank pain currently. She has urinary frequency, urgency and cloudy urine. KUB today shows right 1cm renal calculus and left 1cm and 61m renal calculus.    PMH: Past Medical History:  Diagnosis Date   Acute pyelonephritis 01/24/2018   Anxiety    Arthritis    Depression    Diabetes mellitus without complication ([place]    Dyslipidemia    History of kidney stones    Hypertension    Iron deficiency anemia due to chronic blood loss    Mucous membrane pemphigoid 2014   so far only infects mouth. treated with oral steroids for flares. Lidex as well.    Sleep apnea     Surgical History: Past Surgical History:  Procedure Laterality Date   BIOPSY  11/17/2017   Procedure: BIOPSY;  Surgeon: FDanie Binder MD;  [facility];  Service: Endoscopy;;  gastric duodenum   CESAREAN SECTION     twice   CHOLECYSTECTOMY     COLONOSCOPY  10/2012   Dr. SWest Carbo normal. due 10/2017.    COLONOSCOPY WITH PROPOFOL N/A 11/17/2017   Procedure: COLONOSCOPY WITH PROPOFOL;  Surgeon: FDanie Binder MD;  [facility];  Service: Endoscopy;  Laterality: N/A;  8:45am   ESOPHAGOGASTRODUODENOSCOPY (EGD) WITH PROPOFOL N/A 11/17/2017   Procedure: ESOPHAGOGASTRODUODENOSCOPY (EGD) WITH PROPOFOL;  Surgeon: FDanie Binder MD;  [facility];  Service: Endoscopy;  Laterality: N/A;   GIVENS CAPSULE STUDY N/A 11/30/2017   Procedure: GIVENS CAPSULE STUDY;  Surgeon: FDanie Binder MD;   [facility];  Service: Endoscopy;  Laterality: N/A;  7:30am   LITHOTRIPSY  2017   POLYPECTOMY  11/17/2017   Procedure: POLYPECTOMY;  Surgeon: FDanie Binder MD;  [facility];  Service: Endoscopy;;  colon    REPLACEMENT TOTAL KNEE BILATERAL     TONSILLECTOMY     TOTAL HIP ARTHROPLASTY Right     Home Medications:  Allergies as of 01/10/2022       Reactions   Nsaids Itching, Rash   If given in large doses.  If given in large doses.    Sulfa Antibiotics Rash   Other Hives, Itching, Swelling    : Tape, Other Reaction: hives. "cloth tape is better"   Erythromycin Hives, Rash   Tamsulosin Itching, Rash   Tape Itching, Rash   Tapentadol Itching, Rash        Medication List        Accurate as of January 10, 2022 11:13 AM. If you have any questions, ask your nurse or doctor.          acetaminophen 500 MG tablet Commonly known as: TYLENOL Take 1,000 mg by mouth 3 (three) times daily as needed for moderate pain or headache.   amLODipine 5 MG tablet Commonly known as: NORVASC Take 5 mg by mouth daily with supper.   atorvastatin 20 MG tablet Commonly known as: LIPITOR Take 10 mg by mouth daily with  supper.   Benicar HCT 20-12.5 MG tablet Generic drug: olmesartan-hydrochlorothiazide Take 1 tablet by mouth daily.   cetirizine 10 MG tablet Commonly known as: ZYRTEC Take 10 mg by mouth at bedtime as needed for allergies.   desvenlafaxine 50 MG 24 hr tablet Commonly known as: PRISTIQ Take 50 mg by mouth daily.   dexamethasone 0.5 MG/5ML solution Commonly known as: DECADRON Take 5 mLs by mouth 2 (two) times daily as needed (oral sores).   estradiol 0.1 MG/GM vaginal cream Commonly known as: ESTRACE SMARTSIG:Gram(s) Vaginal Twice a Week   famotidine 20 MG tablet Commonly known as: PEPCID Take by mouth.   fenofibrate 160 MG tablet Take 160 mg by mouth daily with lunch.   fenofibrate micronized 200 MG capsule Commonly known as:  LOFIBRA Take 200 mg by mouth daily.   Fish Oil 1200 MG Caps Take 1,200 mg by mouth 3 (three) times daily.   fluocinonide gel 0.05 % Commonly known as: LIDEX 1 application See admin instructions. Apply orally (dont swallow) 3 times daily as needed for mouth sores   Gemtesa 75 MG Tabs Generic drug: Vibegron Take 1 tablet by mouth daily.   ibuprofen 200 MG tablet Commonly known as: ADVIL Take 400 mg by mouth 3 (three) times daily as needed for headache or moderate pain.   metFORMIN 1000 MG tablet Commonly known as: GLUCOPHAGE Take 500-1,000 mg by mouth See admin instructions. Take 1000 mg with breakfast, 500 mg with lunch, and 1000 mg with dinner   metFORMIN 500 MG 24 hr tablet Commonly known as: GLUCOPHAGE-XR Take 500 mg by mouth 3 (three) times daily.   Mounjaro 15 MG/0.5ML Pen Generic drug: tirzepatide Inject into the skin.   multivitamin tablet Take 1 tablet by mouth daily with lunch.   omeprazole 20 MG capsule Commonly known as: PRILOSEC 1 PO 30 MINS PRIOR TO BREAKFAST.   PRESERVISION AREDS PO Take 1 capsule by mouth 2 (two) times daily.   ranitidine 150 MG tablet Commonly known as: ZANTAC Take 150 mg by mouth at bedtime as needed for heartburn.   SYSTANE ULTRA OP Place 1 drop into both eyes 4 (four) times daily as needed (dry eyes).   tamsulosin 0.4 MG Caps capsule Commonly known as: FLOMAX Take 0.4 mg by mouth daily as needed (kidney stone).   Vitamin D3 125 MCG (5000 UT) Tabs Take 5,000 Units by mouth daily with supper.        Allergies:  Allergies  Allergen Reactions   Nsaids Itching and Rash    If given in large doses.  If given in large doses.    Sulfa Antibiotics Rash   Other Hives, Itching and Swelling     : Tape, Other Reaction: hives. "cloth tape is better"   Erythromycin Hives and Rash   Tamsulosin Itching and Rash   Tape Itching and Rash   Tapentadol Itching and Rash    Family History: Family History  Problem Relation Age of  Onset   Liver cancer Mother        ?started in pancreas   Parkinson's disease Father    Pneumonia Father    Colon cancer Father 35       metastatic but did well   Thyroid cancer Father    Other Daughter 70       brain tumor, followed with serial MRIs. now age 73   Colon cancer Paternal Aunt 90    Social History:  reports that she quit smoking about 54 years ago. Her smoking  use included cigarettes. She has a 0.25 pack-year smoking history. She has never used smokeless tobacco. She reports current alcohol use. She reports that she does not use drugs.  ROS: All other review of systems were reviewed and are negative except what is noted above in HPI  Physical Exam: BP 121/75   Pulse 84   Constitutional:  Alert and oriented, No acute distress. HEENT: [place] AT, moist mucus membranes.  Trachea midline, no masses. Cardiovascular: No clubbing, cyanosis, or edema. Respiratory: Normal respiratory effort, no increased work of breathing. GI: Abdomen is soft, nontender, nondistended, no abdominal masses GU: No CVA tenderness.  Lymph: No cervical or inguinal lymphadenopathy. Skin: No rashes, bruises or suspicious lesions. Neurologic: Grossly intact, no focal deficits, moving all 4 extremities. Psychiatric: Normal mood and affect.  Laboratory Data: Lab Results  Component Value Date   WBC 13.6 (H) 01/27/2018   HGB 8.9 (L) 01/27/2018   HCT 29.6 (L) 01/27/2018   MCV 80.9 01/27/2018   PLT 336 01/27/2018    Lab Results  Component Value Date   CREATININE 0.96 01/25/2018    No results found for: "PSA"  No results found for: "TESTOSTERONE"  No results found for: "HGBA1C"  Urinalysis    Component Value Date/Time   COLORURINE AMBER (A) 01/24/2018 1820   APPEARANCEUR HAZY (A) 01/24/2018 1820   LABSPEC 1.015 01/24/2018 1820   PHURINE 5.0 01/24/2018 1820   GLUCOSEU 50 (A) 01/24/2018 1820   HGBUR MODERATE (A) 01/24/2018 1820   BILIRUBINUR NEGATIVE 01/24/2018 1820   KETONESUR 5 (A)  01/24/2018 1820   PROTEINUR 100 (A) 01/24/2018 1820   NITRITE NEGATIVE 01/24/2018 1820   LEUKOCYTESUR MODERATE (A) 01/24/2018 1820    Lab Results  Component Value Date   BACTERIA RARE (A) 01/24/2018    Pertinent Imaging: KUB today: Images reviewed and discussed with the patient No results found for this or any previous visit.  No results found for this or any previous visit.  No results found for this or any previous visit.  No results found for this or any previous visit.  No results found for this or any previous visit.  No valid procedures specified. No results found for this or any previous visit.  Results for orders placed during the hospital encounter of 01/24/18  CT Renal Stone Study  Narrative CLINICAL DATA:  History of UTI and flank pain, initial encounter  EXAM: CT ABDOMEN AND PELVIS WITHOUT CONTRAST  TECHNIQUE: Multidetector CT imaging of the abdomen and pelvis was performed following the standard protocol without IV contrast.  COMPARISON:  None.  FINDINGS: Lower chest: Lung bases demonstrate no focal infiltrate or sizable effusion. A 5 mm subpleural nodular density is noted in the left lower lobe best seen on image number 10 of series 4.  Hepatobiliary: No focal liver abnormality is seen. Status post cholecystectomy. No biliary dilatation.  Pancreas: Unremarkable. No pancreatic ductal dilatation or surrounding inflammatory changes.  Spleen: Normal in size without focal abnormality.  Adrenals/Urinary Tract: Adrenal glands are within normal limits bilaterally. The left kidney demonstrates a central renal pelvic stone measuring 12 mm. No obstructive changes are seen. Bladder is partially distended. Right kidney demonstrates significant perinephric stranding and appears somewhat enlarged when compared with the left. Tiny nonobstructing stone is noted in the right mid to lower pole. No significant hydronephrosis is noted although some thickening of  the renal pelvis is seen. This may be related to the underlying UTI. No ureteral stone or obstructive changes are noted.  Stomach/Bowel: No  obstructive or inflammatory changes of the bowel are seen. The appendix is within normal limits. Laxity of the anterior abdominal wall is seen with large panniculus anteriorly.  Vascular/Lymphatic: No significant vascular findings are present. No enlarged abdominal or pelvic lymph nodes.  Reproductive: Uterus and bilateral adnexa are unremarkable.  Other: No abdominal wall hernia or abnormality. No abdominopelvic ascites.  Musculoskeletal: No acute or significant osseous findings. Right hip replacement is noted.  IMPRESSION: 12 mm nonobstructing left renal pelvic stone.  Tiny nonobstructing right renal stone.  Perinephric stranding as well as some enlargement of the right kidney and thickening of Gerota's fascia. This may be related to changes from recently passed stone. Possibility of pyelonephritis deserves consideration.  Left lower lobe 5 mm subpleural nodule. No follow-up needed if patient is low-risk. Non-contrast chest CT can be considered in 12 months if patient is high-risk. This recommendation follows the consensus statement: Guidelines for Management of Incidental Pulmonary Nodules Detected on CT Images: From the Fleischner Society 2017; Radiology 2017; 284:228-243.   Electronically Signed By: Inez Catalina M.D. On: 01/24/2018 19:45   Assessment & Plan:    1. Renal stone -We discussed the management of kidney stones. These options include observation, ureteroscopy, shockwave lithotripsy (ESWL) and percutaneous nephrolithotomy (PCNL). We discussed which options are relevant to the patient's stone(s). We discussed the natural history of kidney stones as well as the complications of untreated stones and the impact on quality of life without treatment as well as with each of the above listed treatments. We also discussed the  efficacy of each treatment in its ability to clear the stone burden. With any of these management options I discussed the signs and symptoms of infection and the need for emergent treatment should these be experienced. For each option we discussed the ability of each procedure to clear the patient of their stone burden.   For observation I described the risks which include but are not limited to silent renal damage, life-threatening infection, need for emergent surgery, failure to pass stone and pain.   For ureteroscopy I described the risks which include bleeding, infection, damage to contiguous structures, positioning injury, ureteral stricture, ureteral avulsion, ureteral injury, need for prolonged ureteral stent, inability to perform ureteroscopy, need for an interval procedure, inability to clear stone burden, stent discomfort/pain, heart attack, stroke, pulmonary embolus and the inherent risks with general anesthesia.   For shockwave lithotripsy I described the risks which include arrhythmia, kidney contusion, kidney hemorrhage, need for transfusion, pain, inability to adequately break up stone, inability to pass stone fragments, Steinstrasse, infection associated with obstructing stones, need for alternate surgical procedure, need for repeat shockwave lithotripsy, MI, CVA, PE and the inherent risks with anesthesia/conscious sedation.   For PCNL I described the risks including positioning injury, pneumothorax, hydrothorax, need for chest tube, inability to clear stone burden, renal laceration, arterial venous fistula or malformation, need for embolization of kidney, loss of kidney or renal function, need for repeat procedure, need for prolonged nephrostomy tube, ureteral avulsion, MI, CVA, PE and the inherent risks of general anesthesia.   - The patient would like to proceed with We will start indapamide 2.'5mg'$  daily - Urinalysis, Routine w reflex microscopic - Abdomen 1 view (KUB)   No  follow-ups on file.  Nicolette Bang, MD  [facility]

## 2022-01-14 ENCOUNTER — Telehealth: Payer: Self-pay

## 2022-01-14 NOTE — Telephone Encounter (Signed)
Patient called office today with concerns of indapamide and sulfa reaction Patient reports she did take one with no reaction  Reviewed with Dr. Alyson Ingles. Cross reaction is low and recommended patient try for 1 week and stop of symptoms started. Patient voiced understanding.

## 2022-03-05 ENCOUNTER — Telehealth: Payer: Self-pay

## 2022-03-05 DIAGNOSIS — N200 Calculus of kidney: Secondary | ICD-10-CM

## 2022-03-05 NOTE — Telephone Encounter (Signed)
Patient called advising she needed a prescription sent in for below medication. She advised the original prescription was sent in by another provide she no longer goes to.    Medication: GEMTESA 75 MG TABS    Pharmacy: [place], [place]   Thank you

## 2022-03-05 NOTE — Telephone Encounter (Signed)
Made patient aware that I will send a task to Dr. Alyson Ingles and someone will be in contact with her once Dr.McKenzie. Patient voiced understanding

## 2022-03-11 MED ORDER — GEMTESA 75 MG PO TABS: 75.0000 mg | ORAL_TABLET | ORAL | 11 refills | Status: AC

## 2022-03-11 NOTE — Telephone Encounter (Signed)
Tried calling patient with no answer, will follow up later.

## 2022-03-12 NOTE — Telephone Encounter (Signed)
Made patient aware that rx for Gemtesa '75mg'$  was sent to her pharmacy. Patient voiced understanding

## 2022-04-14 ENCOUNTER — Ambulatory Visit (HOSPITAL_COMMUNITY)
Admission: RE | Admit: 2022-04-14 | Discharge: 2022-04-14 | Disposition: A | Discharge status: Home / Self Care | Payer: Medicare Other | Source: Ambulatory Visit | Attending: Urology | Admitting: Urology

## 2022-04-14 ENCOUNTER — Encounter: Payer: Self-pay | Admitting: Urology

## 2022-04-14 ENCOUNTER — Ambulatory Visit (INDEPENDENT_AMBULATORY_CARE_PROVIDER_SITE_OTHER): Payer: Medicare Other | Admitting: Urology

## 2022-04-14 VITALS — BP 126/70 | HR 121

## 2022-04-14 DIAGNOSIS — N200 Calculus of kidney: Secondary | ICD-10-CM

## 2022-04-14 DIAGNOSIS — N3001 Acute cystitis with hematuria: Secondary | ICD-10-CM

## 2022-04-14 LAB — URINALYSIS, ROUTINE W REFLEX MICROSCOPIC
Bilirubin, UA: NEGATIVE
Glucose, UA: NEGATIVE
Ketones, UA: NEGATIVE
Nitrite, UA: POSITIVE — AB
Specific Gravity, UA: 1.015 (ref 1.005–1.030)
Urobilinogen, Ur: 0.2 mg/dL (ref 0.2–1.0)
pH, UA: 6 (ref 5.0–7.5)

## 2022-04-14 LAB — MICROSCOPIC EXAMINATION: WBC, UA: >30 /hpf — AB (ref 0–5)

## 2022-04-14 MED ORDER — NITROFURANTOIN MONOHYD MACRO 100 MG PO CAPS: 100.0000 mg | ORAL_CAPSULE | Freq: Two times a day (BID) | ORAL | 0 refills | Status: DC

## 2022-04-14 NOTE — Progress Notes (Signed)
04/14/2022 11:05 AM   Felicia Hogan [birth year] [id number]  Referring provider: Earney Mallet, MD [address] [place] [address]  Followup nephrolithiasis   HPI: Ms Hintze is a 77yo here for followup for nephrolithiasis. She had an issue with ionized calcium and her BP meds were discontinued. Ionized calcium now normal. She was treated for a UTI in December 2023. UA today is concerning for infection. She has had dysuria and increased urinary frequency for the past 3-4 days.  KUB from today shows stable bilateral renal calculi.    PMH: Past Medical History:  Diagnosis Date   Acute pyelonephritis 01/24/2018   Anxiety    Arthritis    Depression    Diabetes mellitus without complication ([place])    Dyslipidemia    History of kidney stones    Hypertension    Iron deficiency anemia due to chronic blood loss    Mucous membrane pemphigoid 2014   so far only infects mouth. treated with oral steroids for flares. Lidex as well.    Sleep apnea     Surgical History: Past Surgical History:  Procedure Laterality Date   BIOPSY  11/17/2017   Procedure: BIOPSY;  Surgeon: Danie Binder, MD;  [facility];  Service: Endoscopy;;  gastric duodenum   CESAREAN SECTION     twice   CHOLECYSTECTOMY     COLONOSCOPY  10/2012   Dr. [place] Carbo: normal. due 10/2017.    COLONOSCOPY WITH PROPOFOL N/A 11/17/2017   Procedure: COLONOSCOPY WITH PROPOFOL;  Surgeon: Danie Binder, MD;  [facility]: [facility];  Service: Endoscopy;  Laterality: N/A;  8:45am   ESOPHAGOGASTRODUODENOSCOPY (EGD) WITH PROPOFOL N/A 11/17/2017   Procedure: ESOPHAGOGASTRODUODENOSCOPY (EGD) WITH PROPOFOL;  Surgeon: Danie Binder, MD;  [facility];  Service: Endoscopy;  Laterality: N/A;   GIVENS CAPSULE STUDY N/A 11/30/2017   Procedure: GIVENS CAPSULE STUDY;  Surgeon: Danie Binder, MD;  [facility]: [facility];  Service: Endoscopy;  Laterality: N/A;  7:30am   LITHOTRIPSY  2017   POLYPECTOMY  11/17/2017    Procedure: POLYPECTOMY;  Surgeon: Danie Binder, MD;  [facility]: [facility];  Service: Endoscopy;;  colon    REPLACEMENT TOTAL KNEE BILATERAL     TONSILLECTOMY     TOTAL HIP ARTHROPLASTY Right     Home Medications:  Allergies as of 04/14/2022       Reactions   Nsaids Itching, Rash   If given in large doses.  If given in large doses.    Sulfa Antibiotics Rash   Other Hives, Itching, Swelling    : Tape, Other Reaction: hives. "cloth tape is better"   Erythromycin Hives, Rash   Tamsulosin Itching, Rash   Tape Itching, Rash   Tapentadol Itching, Rash        Medication List        Accurate as of April 14, 2022 11:05 AM. If you have any questions, ask your nurse or doctor.          acetaminophen 500 MG tablet Commonly known as: TYLENOL Take 1,000 mg by mouth 3 (three) times daily as needed for moderate pain or headache.   amLODipine 5 MG tablet Commonly known as: NORVASC Take 5 mg by mouth daily with supper.   atorvastatin 20 MG tablet Commonly known as: LIPITOR Take 10 mg by mouth daily with supper.   Benicar HCT 20-12.5 MG tablet Generic drug: olmesartan-hydrochlorothiazide Take 1 tablet by mouth daily.   cetirizine 10 MG tablet Commonly  known as: ZYRTEC Take 10 mg by mouth at bedtime as needed for allergies.   desvenlafaxine 50 MG 24 hr tablet Commonly known as: PRISTIQ Take 50 mg by mouth daily.   dexamethasone 0.5 MG/5ML solution Commonly known as: DECADRON Take 5 mLs by mouth 2 (two) times daily as needed (oral sores).   estradiol 0.1 MG/GM vaginal cream Commonly known as: ESTRACE SMARTSIG:Gram(s) Vaginal Twice a Week   famotidine 20 MG tablet Commonly known as: PEPCID Take by mouth.   fenofibrate 160 MG tablet Take 160 mg by mouth daily with lunch.   fenofibrate micronized 200 MG capsule Commonly known as: LOFIBRA Take 200 mg by mouth daily.   Fish Oil 1200 MG Caps Take 1,200 mg by mouth 3 (three) times daily.   fluocinonide gel  0.05 % Commonly known as: LIDEX 1 application See admin instructions. Apply orally (dont swallow) 3 times daily as needed for mouth sores   Gemtesa 75 MG Tabs Generic drug: Vibegron Take 1 tablet by mouth daily.   ibuprofen 200 MG tablet Commonly known as: ADVIL Take 400 mg by mouth 3 (three) times daily as needed for headache or moderate pain.   indapamide 2.5 MG tablet Commonly known as: LOZOL Take 1 tablet (2.5 mg total) by mouth daily.   metFORMIN 1000 MG tablet Commonly known as: GLUCOPHAGE Take 500-1,000 mg by mouth See admin instructions. Take 1000 mg with breakfast, 500 mg with lunch, and 1000 mg with dinner   metFORMIN 500 MG 24 hr tablet Commonly known as: GLUCOPHAGE-XR Take 500 mg by mouth 3 (three) times daily.   Mounjaro 15 MG/0.5ML Pen Generic drug: tirzepatide Inject into the skin.   multivitamin tablet Take 1 tablet by mouth daily with lunch.   omeprazole 20 MG capsule Commonly known as: PRILOSEC 1 PO 30 MINS PRIOR TO BREAKFAST.   PRESERVISION AREDS PO Take 1 capsule by mouth 2 (two) times daily.   ranitidine 150 MG tablet Commonly known as: ZANTAC Take 150 mg by mouth at bedtime as needed for heartburn.   SYSTANE ULTRA OP Place 1 drop into both eyes 4 (four) times daily as needed (dry eyes).   tamsulosin 0.4 MG Caps capsule Commonly known as: FLOMAX Take 0.4 mg by mouth daily as needed (kidney stone).   Vitamin D3 125 MCG (5000 UT) Tabs Take 5,000 Units by mouth daily with supper.        Allergies:  Allergies  Allergen Reactions   Nsaids Itching and Rash    If given in large doses.  If given in large doses.    Sulfa Antibiotics Rash   Other Hives, Itching and Swelling     : Tape, Other Reaction: hives. "cloth tape is better"   Erythromycin Hives and Rash   Tamsulosin Itching and Rash   Tape Itching and Rash   Tapentadol Itching and Rash    Family History: Family History  Problem Relation Age of Onset   Liver cancer Mother         ?started in pancreas   Parkinson's disease Father    Pneumonia Father    Colon cancer Father 17       metastatic but did well   Thyroid cancer Father    Other Daughter 54       brain tumor, followed with serial MRIs. now age 78   Colon cancer Paternal Aunt 90    Social History:  reports that she quit smoking about 54 years ago. Her smoking use included cigarettes. She has  a 0.25 pack-year smoking history. She has never used smokeless tobacco. She reports current alcohol use. She reports that she does not use drugs.  ROS: All other review of systems were reviewed and are negative except what is noted above in HPI  Physical Exam: BP 126/70   Pulse (!) 121   Constitutional:  Alert and oriented, No acute distress. HEENT: [place] AT, moist mucus membranes.  Trachea midline, no masses. Cardiovascular: No clubbing, cyanosis, or edema. Respiratory: Normal respiratory effort, no increased work of breathing. GI: Abdomen is soft, nontender, nondistended, no abdominal masses GU: No CVA tenderness.  Lymph: No cervical or inguinal lymphadenopathy. Skin: No rashes, bruises or suspicious lesions. Neurologic: Grossly intact, no focal deficits, moving all 4 extremities. Psychiatric: Normal mood and affect.  Laboratory Data: Lab Results  Component Value Date   WBC 13.6 (H) 01/27/2018   HGB 8.9 (L) 01/27/2018   HCT 29.6 (L) 01/27/2018   MCV 80.9 01/27/2018   PLT 336 01/27/2018    Lab Results  Component Value Date   CREATININE 0.96 01/25/2018    No results found for: "PSA"  No results found for: "TESTOSTERONE"  No results found for: "HGBA1C"  Urinalysis    Component Value Date/Time   COLORURINE AMBER (A) 01/24/2018 1820   APPEARANCEUR Cloudy (A) 01/10/2022 1112   LABSPEC 1.015 01/24/2018 1820   PHURINE 5.0 01/24/2018 1820   GLUCOSEU Negative 01/10/2022 1112   HGBUR MODERATE (A) 01/24/2018 1820   BILIRUBINUR Negative 01/10/2022 1112   KETONESUR 5 (A) 01/24/2018 1820   PROTEINUR  2+ (A) 01/10/2022 1112   PROTEINUR 100 (A) 01/24/2018 1820   NITRITE Negative 01/10/2022 1112   NITRITE NEGATIVE 01/24/2018 1820   LEUKOCYTESUR 2+ (A) 01/10/2022 1112    Lab Results  Component Value Date   LABMICR See below: 01/10/2022   WBCUA >30 (A) 01/10/2022   LABEPIT 0-10 01/10/2022   BACTERIA Few 01/10/2022    Pertinent Imaging: KUB today: Images reviewed and disucssed with the patient  Results for orders placed in visit on 01/10/22  Abdomen 1 view (KUB)  Narrative CLINICAL DATA:  nephrolithiasis  EXAM: ABDOMEN - 1 VIEW  COMPARISON:  CT 01/24/2018  FINDINGS: There is a 9 mm calcification overlying the right kidney. There are 11 mm and 7 mm calcifications overlying the left kidney. Nonobstructive bowel gas pattern. Multilevel degenerative changes of the spine. No acute osseous abnormality. Right hip arthroplasty noted.  IMPRESSION: Calcifications overlying the kidneys bilaterally, measuring up to 9 mm on the right and 11 mm on the left, may represent renal stones.   Electronically Signed By: Maurine Simmering M.D. On: 01/10/2022 12:36  No results found for this or any previous visit.  No results found for this or any previous visit.  No results found for this or any previous visit.  No results found for this or any previous visit.  No valid procedures specified. No results found for this or any previous visit.  Results for orders placed during the hospital encounter of 01/24/18  CT Renal Stone Study  Narrative CLINICAL DATA:  History of UTI and flank pain, initial encounter  EXAM: CT ABDOMEN AND PELVIS WITHOUT CONTRAST  TECHNIQUE: Multidetector CT imaging of the abdomen and pelvis was performed following the standard protocol without IV contrast.  COMPARISON:  None.  FINDINGS: Lower chest: Lung bases demonstrate no focal infiltrate or sizable effusion. A 5 mm subpleural nodular density is noted in the left lower lobe best seen on image number  10 of series  4.  Hepatobiliary: No focal liver abnormality is seen. Status post cholecystectomy. No biliary dilatation.  Pancreas: Unremarkable. No pancreatic ductal dilatation or surrounding inflammatory changes.  Spleen: Normal in size without focal abnormality.  Adrenals/Urinary Tract: Adrenal glands are within normal limits bilaterally. The left kidney demonstrates a central renal pelvic stone measuring 12 mm. No obstructive changes are seen. Bladder is partially distended. Right kidney demonstrates significant perinephric stranding and appears somewhat enlarged when compared with the left. Tiny nonobstructing stone is noted in the right mid to lower pole. No significant hydronephrosis is noted although some thickening of the renal pelvis is seen. This may be related to the underlying UTI. No ureteral stone or obstructive changes are noted.  Stomach/Bowel: No obstructive or inflammatory changes of the bowel are seen. The appendix is within normal limits. Laxity of the anterior abdominal wall is seen with large panniculus anteriorly.  Vascular/Lymphatic: No significant vascular findings are present. No enlarged abdominal or pelvic lymph nodes.  Reproductive: Uterus and bilateral adnexa are unremarkable.  Other: No abdominal wall hernia or abnormality. No abdominopelvic ascites.  Musculoskeletal: No acute or significant osseous findings. Right hip replacement is noted.  IMPRESSION: 12 mm nonobstructing left renal pelvic stone.  Tiny nonobstructing right renal stone.  Perinephric stranding as well as some enlargement of the right kidney and thickening of Gerota's fascia. This may be related to changes from recently passed stone. Possibility of pyelonephritis deserves consideration.  Left lower lobe 5 mm subpleural nodule. No follow-up needed if patient is low-risk. Non-contrast chest CT can be considered in 12 months if patient is high-risk. This recommendation follows  the consensus statement: Guidelines for Management of Incidental Pulmonary Nodules Detected on CT Images: From the Fleischner Society 2017; Radiology 2017; 284:228-243.   Electronically Signed By: Inez Catalina M.D. On: 01/24/2018 19:45   Assessment & Plan:    1. Renal stone Patient wishes to pursue observation - Urinalysis, Routine w reflex microscopic  2. Acute cystitis -Urine for culture -macrobid '100mg'$  BID for 7 days  No follow-ups on file.  Nicolette Bang, MD  [facility] [place]

## 2022-04-14 NOTE — Patient Instructions (Signed)

## 2022-04-16 LAB — URINE CULTURE

## 2022-04-28 ENCOUNTER — Telehealth: Payer: Self-pay

## 2022-04-28 MED ORDER — NITROFURANTOIN MONOHYD MACRO 100 MG PO CAPS: 100.0000 mg | ORAL_CAPSULE | Freq: Two times a day (BID) | ORAL | 0 refills | Status: DC

## 2022-04-28 MED ORDER — NITROFURANTOIN MACROCRYSTAL 50 MG PO CAPS: 50.0000 mg | ORAL_CAPSULE | Freq: Every day | ORAL | 11 refills | Status: DC

## 2022-04-28 NOTE — Telephone Encounter (Signed)
Patient called stating that after completing antibiotic her symptoms returned.  Reviewed symptoms with Dr. Alyson Ingles. Per MD Macrobid 100 mg po bid x 7 days sent in along with Macrodantin 50 mg po q hs for patient to start after completion of  7 day course.  Patient called and made aware and voiced understanding.

## 2022-05-20 ENCOUNTER — Ambulatory Visit (INDEPENDENT_AMBULATORY_CARE_PROVIDER_SITE_OTHER): Payer: Medicare Other | Admitting: Urology

## 2022-05-20 ENCOUNTER — Encounter: Payer: Self-pay | Admitting: Urology

## 2022-05-20 VITALS — BP 118/76 | HR 97

## 2022-05-20 DIAGNOSIS — R3915 Urgency of urination: Secondary | ICD-10-CM

## 2022-05-20 DIAGNOSIS — N3001 Acute cystitis with hematuria: Secondary | ICD-10-CM

## 2022-05-20 DIAGNOSIS — Z87442 Personal history of urinary calculi: Secondary | ICD-10-CM | POA: Diagnosis not present

## 2022-05-20 DIAGNOSIS — R300 Dysuria: Secondary | ICD-10-CM | POA: Diagnosis not present

## 2022-05-20 DIAGNOSIS — N200 Calculus of kidney: Secondary | ICD-10-CM

## 2022-05-20 DIAGNOSIS — R350 Frequency of micturition: Secondary | ICD-10-CM | POA: Diagnosis not present

## 2022-05-20 MED ORDER — CEFUROXIME AXETIL 500 MG PO TABS: 500.0000 mg | ORAL_TABLET | Freq: Two times a day (BID) | ORAL | 0 refills | Status: DC

## 2022-05-20 NOTE — Progress Notes (Unsigned)
05/20/2022 3:15 PM   [name] [birth year] [id number]  Referring provider: Earney Mallet, MD [address] [place] [address]  No chief complaint on file.   HPI: She is scheduled for parathyroidectomy March 26th. Calcium 75.2. She has know renal stones. UA today is concerning for infection. She is currently on macrodantin.    PMH: Past Medical History:  Diagnosis Date   Acute pyelonephritis 01/24/2018   Anxiety    Arthritis    Depression    Diabetes mellitus without complication ([place])    Dyslipidemia    History of kidney stones    Hypertension    Iron deficiency anemia due to chronic blood loss    Mucous membrane pemphigoid 2014   so far only infects mouth. treated with oral steroids for flares. Lidex as well.    Sleep apnea     Surgical History: Past Surgical History:  Procedure Laterality Date   BIOPSY  11/17/2017   Procedure: BIOPSY;  Surgeon: Danie Binder, MD;  [facility];  Service: Endoscopy;;  gastric duodenum   CESAREAN SECTION     twice   CHOLECYSTECTOMY     COLONOSCOPY  10/2012   Dr. West Carbo: normal. due 10/2017.    COLONOSCOPY WITH PROPOFOL N/A 11/17/2017   Procedure: COLONOSCOPY WITH PROPOFOL;  Surgeon: Danie Binder, MD;  [facility]: [facility];  Service: Endoscopy;  Laterality: N/A;  8:45am   ESOPHAGOGASTRODUODENOSCOPY (EGD) WITH PROPOFOL N/A 11/17/2017   Procedure: ESOPHAGOGASTRODUODENOSCOPY (EGD) WITH PROPOFOL;  Surgeon: Danie Binder, MD;  [facility];  Service: Endoscopy;  Laterality: N/A;   GIVENS CAPSULE STUDY N/A 11/30/2017   Procedure: GIVENS CAPSULE STUDY;  Surgeon: Danie Binder, MD;  [facility]: [facility];  Service: Endoscopy;  Laterality: N/A;  7:30am   LITHOTRIPSY  2017   POLYPECTOMY  11/17/2017   Procedure: POLYPECTOMY;  Surgeon: Danie Binder, MD;  [facility]: [facility];  Service: Endoscopy;;  colon    REPLACEMENT TOTAL KNEE BILATERAL     TONSILLECTOMY     TOTAL HIP ARTHROPLASTY Right      Home Medications:  Allergies as of 05/20/2022       Reactions   Nsaids Itching, Rash   If given in large doses.  If given in large doses.    Sulfa Antibiotics Rash   Other Hives, Itching, Swelling    : Tape, Other Reaction: hives. "cloth tape is better"   Erythromycin Hives, Rash   Tamsulosin Itching, Rash   Tape Itching, Rash   Tapentadol Itching, Rash        Medication List        Accurate as of May 20, 2022  3:15 PM. If you have any questions, ask your nurse or doctor.          acetaminophen 500 MG tablet Commonly known as: TYLENOL Take 1,000 mg by mouth 3 (three) times daily as needed for moderate pain or headache.   amLODipine 5 MG tablet Commonly known as: NORVASC Take 5 mg by mouth daily with supper.   atorvastatin 20 MG tablet Commonly known as: LIPITOR Take 10 mg by mouth daily with supper.   Benicar HCT 20-12.5 MG tablet Generic drug: olmesartan-hydrochlorothiazide Take 1 tablet by mouth daily.   cetirizine 10 MG tablet Commonly known as: ZYRTEC Take 10 mg by mouth at bedtime as needed for allergies.   desvenlafaxine 50 MG 24 hr tablet Commonly known as: PRISTIQ Take 50 mg by mouth daily.   dexamethasone  0.5 MG/5ML solution Commonly known as: DECADRON Take 5 mLs by mouth 2 (two) times daily as needed (oral sores).   estradiol 0.1 MG/GM vaginal cream Commonly known as: ESTRACE SMARTSIG:Gram(s) Vaginal Twice a Week   famotidine 20 MG tablet Commonly known as: PEPCID Take by mouth.   fenofibrate 160 MG tablet Take 160 mg by mouth daily with lunch.   fenofibrate micronized 200 MG capsule Commonly known as: LOFIBRA Take 200 mg by mouth daily.   Fish Oil 1200 MG Caps Take 1,200 mg by mouth 3 (three) times daily.   fluocinonide gel 0.05 % Commonly known as: LIDEX 1 application See admin instructions. Apply orally (dont swallow) 3 times daily as needed for mouth sores   Gemtesa 75 MG Tabs Generic drug: Vibegron Take 1 tablet  by mouth daily.   ibuprofen 200 MG tablet Commonly known as: ADVIL Take 400 mg by mouth 3 (three) times daily as needed for headache or moderate pain.   indapamide 2.5 MG tablet Commonly known as: LOZOL Take 1 tablet (2.5 mg total) by mouth daily.   metFORMIN 1000 MG tablet Commonly known as: GLUCOPHAGE Take 500-1,000 mg by mouth See admin instructions. Take 1000 mg with breakfast, 500 mg with lunch, and 1000 mg with dinner   metFORMIN 500 MG 24 hr tablet Commonly known as: GLUCOPHAGE-XR Take 500 mg by mouth 3 (three) times daily.   Mounjaro 15 MG/0.5ML Pen Generic drug: tirzepatide Inject into the skin.   multivitamin tablet Take 1 tablet by mouth daily with lunch.   nitrofurantoin (macrocrystal-monohydrate) 100 MG capsule Commonly known as: MACROBID Take 1 capsule (100 mg total) by mouth every 12 (twelve) hours.   nitrofurantoin (macrocrystal-monohydrate) 100 MG capsule Commonly known as: MACROBID Take 1 capsule (100 mg total) by mouth 2 (two) times daily.   nitrofurantoin 50 MG capsule Commonly known as: Macrodantin Take 1 capsule (50 mg total) by mouth at bedtime. Take nightly after after completing 7 day of macrobid   omeprazole 20 MG capsule Commonly known as: PRILOSEC 1 PO 30 MINS PRIOR TO BREAKFAST.   PRESERVISION AREDS PO Take 1 capsule by mouth 2 (two) times daily.   ranitidine 150 MG tablet Commonly known as: ZANTAC Take 150 mg by mouth at bedtime as needed for heartburn.   SYSTANE ULTRA OP Place 1 drop into both eyes 4 (four) times daily as needed (dry eyes).   tamsulosin 0.4 MG Caps capsule Commonly known as: FLOMAX Take 0.4 mg by mouth daily as needed (kidney stone).   Vitamin D3 125 MCG (5000 UT) Tabs Generic drug: Cholecalciferol Take 5,000 Units by mouth daily with supper.        Allergies:  Allergies  Allergen Reactions   Nsaids Itching and Rash    If given in large doses.  If given in large doses.    Sulfa Antibiotics Rash    Other Hives, Itching and Swelling     : Tape, Other Reaction: hives. "cloth tape is better"   Erythromycin Hives and Rash   Tamsulosin Itching and Rash   Tape Itching and Rash   Tapentadol Itching and Rash    Family History: Family History  Problem Relation Age of Onset   Liver cancer Mother        ?started in pancreas   Parkinson's disease Father    Pneumonia Father    Colon cancer Father 79       metastatic but did well   Thyroid cancer Father    Other Daughter 17  brain tumor, followed with serial MRIs. now age 15   Colon cancer Paternal Aunt 90    Social History:  reports that she quit smoking about 54 years ago. Her smoking use included cigarettes. She has a 0.25 pack-year smoking history. She has never used smokeless tobacco. She reports current alcohol use. She reports that she does not use drugs.  ROS: All other review of systems were reviewed and are negative except what is noted above in HPI  Physical Exam: BP 118/76   Pulse 97   Constitutional:  Alert and oriented, No acute distress. HEENT: [place] AT, moist mucus membranes.  Trachea midline, no masses. Cardiovascular: No clubbing, cyanosis, or edema. Respiratory: Normal respiratory effort, no increased work of breathing. GI: Abdomen is soft, nontender, nondistended, no abdominal masses GU: No CVA tenderness.  Lymph: No cervical or inguinal lymphadenopathy. Skin: No rashes, bruises or suspicious lesions. Neurologic: Grossly intact, no focal deficits, moving all 4 extremities. Psychiatric: Normal mood and affect.  Laboratory Data: Lab Results  Component Value Date   WBC 13.6 (H) 01/27/2018   HGB 8.9 (L) 01/27/2018   HCT 29.6 (L) 01/27/2018   MCV 80.9 01/27/2018   PLT 336 01/27/2018    Lab Results  Component Value Date   CREATININE 0.96 01/25/2018    No results found for: "PSA"  No results found for: "TESTOSTERONE"  No results found for: "HGBA1C"  Urinalysis    Component Value Date/Time    COLORURINE AMBER (A) 01/24/2018 1820   APPEARANCEUR Turbid (A) 04/14/2022 1012   LABSPEC 1.015 01/24/2018 1820   PHURINE 5.0 01/24/2018 1820   GLUCOSEU Negative 04/14/2022 1012   HGBUR MODERATE (A) 01/24/2018 1820   BILIRUBINUR Negative 04/14/2022 1012   KETONESUR 5 (A) 01/24/2018 1820   PROTEINUR 3+ (A) 04/14/2022 1012   PROTEINUR 100 (A) 01/24/2018 1820   NITRITE Positive (A) 04/14/2022 1012   NITRITE NEGATIVE 01/24/2018 1820   LEUKOCYTESUR 3+ (A) 04/14/2022 1012    Lab Results  Component Value Date   LABMICR See below: 04/14/2022   WBCUA >30 (A) 04/14/2022   LABEPIT 0-10 04/14/2022   BACTERIA Moderate (A) 04/14/2022    Pertinent Imaging: *** Results for orders placed during the hospital encounter of 04/14/22  Abdomen 1 view (KUB)  Narrative CLINICAL DATA:  Nephrolithiasis.  EXAM: ABDOMEN - 1 VIEW  COMPARISON:  January 10, 2022.  FINDINGS: The bowel gas pattern is normal. Grossly stable bilateral nephrolithiasis is noted, with 9 mm calculus on the right. At least 2 left-sided calculi are noted, the largest measuring 1 cm.  IMPRESSION: Grossly stable bilateral nephrolithiasis.   Electronically Signed By: Marijo Conception M.D. On: 04/14/2022 13:47  No results found for this or any previous visit.  No results found for this or any previous visit.  No results found for this or any previous visit.  No results found for this or any previous visit.  No valid procedures specified. No results found for this or any previous visit.  Results for orders placed during the hospital encounter of 01/24/18  CT Renal Stone Study  Narrative CLINICAL DATA:  History of UTI and flank pain, initial encounter  EXAM: CT ABDOMEN AND PELVIS WITHOUT CONTRAST  TECHNIQUE: Multidetector CT imaging of the abdomen and pelvis was performed following the standard protocol without IV contrast.  COMPARISON:  None.  FINDINGS: Lower chest: Lung bases demonstrate no focal  infiltrate or sizable effusion. A 5 mm subpleural nodular density is noted in the left lower lobe best seen  on image number 10 of series 4.  Hepatobiliary: No focal liver abnormality is seen. Status post cholecystectomy. No biliary dilatation.  Pancreas: Unremarkable. No pancreatic ductal dilatation or surrounding inflammatory changes.  Spleen: Normal in size without focal abnormality.  Adrenals/Urinary Tract: Adrenal glands are within normal limits bilaterally. The left kidney demonstrates a central renal pelvic stone measuring 12 mm. No obstructive changes are seen. Bladder is partially distended. Right kidney demonstrates significant perinephric stranding and appears somewhat enlarged when compared with the left. Tiny nonobstructing stone is noted in the right mid to lower pole. No significant hydronephrosis is noted although some thickening of the renal pelvis is seen. This may be related to the underlying UTI. No ureteral stone or obstructive changes are noted.  Stomach/Bowel: No obstructive or inflammatory changes of the bowel are seen. The appendix is within normal limits. Laxity of the anterior abdominal wall is seen with large panniculus anteriorly.  Vascular/Lymphatic: No significant vascular findings are present. No enlarged abdominal or pelvic lymph nodes.  Reproductive: Uterus and bilateral adnexa are unremarkable.  Other: No abdominal wall hernia or abnormality. No abdominopelvic ascites.  Musculoskeletal: No acute or significant osseous findings. Right hip replacement is noted.  IMPRESSION: 12 mm nonobstructing left renal pelvic stone.  Tiny nonobstructing right renal stone.  Perinephric stranding as well as some enlargement of the right kidney and thickening of Gerota's fascia. This may be related to changes from recently passed stone. Possibility of pyelonephritis deserves consideration.  Left lower lobe 5 mm subpleural nodule. No follow-up needed  if patient is low-risk. Non-contrast chest CT can be considered in 12 months if patient is high-risk. This recommendation follows the consensus statement: Guidelines for Management of Incidental Pulmonary Nodules Detected on CT Images: From the Fleischner Society 2017; Radiology 2017; 284:228-243.   Electronically Signed By: Inez Catalina M.D. On: 01/24/2018 19:45   Assessment & Plan:    1. Acute cystitis with hematuria *** - Urinalysis, Routine w reflex microscopic  2. Renal stone ***   No follow-ups on file.  Nicolette Bang, MD  [facility] [place]

## 2022-05-20 NOTE — Patient Instructions (Signed)

## 2022-05-21 LAB — URINALYSIS, ROUTINE W REFLEX MICROSCOPIC
Bilirubin, UA: NEGATIVE
Glucose, UA: NEGATIVE
Ketones, UA: NEGATIVE
Nitrite, UA: NEGATIVE
Specific Gravity, UA: 1.015 (ref 1.005–1.030)
Urobilinogen, Ur: 0.2 mg/dL (ref 0.2–1.0)
pH, UA: 6.5 (ref 5.0–7.5)

## 2022-05-21 LAB — MICROSCOPIC EXAMINATION: WBC, UA: >30 /hpf — AB (ref 0–5)

## 2022-05-22 ENCOUNTER — Encounter: Payer: Self-pay | Admitting: Urology

## 2022-05-24 LAB — URINE CULTURE

## 2022-05-30 ENCOUNTER — Telehealth: Payer: Self-pay

## 2022-05-30 ENCOUNTER — Ambulatory Visit (INDEPENDENT_AMBULATORY_CARE_PROVIDER_SITE_OTHER): Payer: Medicare Other | Admitting: Urology

## 2022-05-30 DIAGNOSIS — R310 Gross hematuria: Secondary | ICD-10-CM

## 2022-05-30 DIAGNOSIS — R300 Dysuria: Secondary | ICD-10-CM

## 2022-05-30 LAB — MICROSCOPIC EXAMINATION
RBC, Urine: >30 /hpf — AB (ref 0–2)
WBC, UA: >30 /hpf — AB (ref 0–5)

## 2022-05-30 LAB — URINALYSIS, ROUTINE W REFLEX MICROSCOPIC
Bilirubin, UA: NEGATIVE
Glucose, UA: NEGATIVE
Ketones, UA: NEGATIVE
Nitrite, UA: NEGATIVE
Specific Gravity, UA: 1.015 (ref 1.005–1.030)
Urobilinogen, Ur: 0.2 mg/dL (ref 0.2–1.0)
pH, UA: 6 (ref 5.0–7.5)

## 2022-05-30 MED ORDER — NITROFURANTOIN MONOHYD MACRO 100 MG PO CAPS: 100.0000 mg | ORAL_CAPSULE | Freq: Two times a day (BID) | ORAL | 0 refills | Status: DC

## 2022-05-30 NOTE — Telephone Encounter (Signed)
Patient is aware that UA was review with Dr. Alyson Ingles. Macrobid was sent to her pharmacy. Patient is aware that urine culture is pending and will reach out once culture is resulted. Patient voiced understanding

## 2022-05-30 NOTE — Telephone Encounter (Signed)
Patient states that she finished her Ceftin abx on Tuesday 03/05.  Her symptoms did not improve and are worsening.  She also states she is having hematuria.  How do you want to proceed?

## 2022-05-30 NOTE — Progress Notes (Signed)
Patient came in today and did a urine drop off. Patient states she is having dysuria along with gross hematuria. Urine review with Dr. Alyson Ingles. Treatment started 05/30/22. Urine culture pending.

## 2022-05-30 NOTE — Telephone Encounter (Signed)
Verbal from Dr. Alyson Ingles to have patient drop off another sample for ua and culture.  Patient aware and is added to [place] schedule.

## 2022-06-04 ENCOUNTER — Telehealth: Payer: Self-pay

## 2022-06-04 DIAGNOSIS — N3001 Acute cystitis with hematuria: Secondary | ICD-10-CM

## 2022-06-04 LAB — URINE CULTURE

## 2022-06-04 MED ORDER — CIPROFLOXACIN HCL 500 MG PO TABS: 500.0000 mg | ORAL_TABLET | Freq: Two times a day (BID) | ORAL | 0 refills | Status: DC

## 2022-06-04 NOTE — Telephone Encounter (Signed)
Patient called and made aware of positive urine culture and Cipro 500 mg po bid x 7 days sent to the pharmacy.

## 2022-06-30 ENCOUNTER — Telehealth: Payer: Self-pay

## 2022-06-30 ENCOUNTER — Ambulatory Visit (INDEPENDENT_AMBULATORY_CARE_PROVIDER_SITE_OTHER): Payer: Medicare Other | Admitting: Urology

## 2022-06-30 DIAGNOSIS — R300 Dysuria: Secondary | ICD-10-CM | POA: Diagnosis not present

## 2022-06-30 DIAGNOSIS — R310 Gross hematuria: Secondary | ICD-10-CM

## 2022-06-30 LAB — MICROSCOPIC EXAMINATION
RBC, Urine: >30 /hpf — AB (ref 0–2)
WBC, UA: >30 /hpf — AB (ref 0–5)

## 2022-06-30 LAB — URINALYSIS, ROUTINE W REFLEX MICROSCOPIC
Bilirubin, UA: NEGATIVE
Glucose, UA: NEGATIVE
Ketones, UA: NEGATIVE
Nitrite, UA: POSITIVE — AB
Specific Gravity, UA: 1.015 (ref 1.005–1.030)
Urobilinogen, Ur: 0.2 mg/dL (ref 0.2–1.0)
pH, UA: 6 (ref 5.0–7.5)

## 2022-06-30 MED ORDER — DOXYCYCLINE HYCLATE 100 MG PO CAPS: 100.0000 mg | ORAL_CAPSULE | Freq: Two times a day (BID) | ORAL | 0 refills | Status: DC

## 2022-06-30 NOTE — Progress Notes (Signed)
Patient presents today with complaints of  burning with urination and gross hematuria .  UA and Culture done today.  Dr. Ronne Binning  reviewed results and Doxycycline 100 mg bid.  Patient aware of MD recommendations and that we will reach out with culture results.      [id number], CMA

## 2022-06-30 NOTE — Telephone Encounter (Signed)
Patient states she has started having burning with urination and gross hematuria.  She is scheduled to follow up with MD on 05/01, pt will come by to drop off a urine for ua and culture.

## 2022-07-03 LAB — URINE CULTURE

## 2022-07-23 ENCOUNTER — Ambulatory Visit (HOSPITAL_COMMUNITY)
Admission: RE | Admit: 2022-07-23 | Discharge: 2022-07-23 | Disposition: A | Discharge status: Home / Self Care | Payer: Medicare Other | Source: Ambulatory Visit | Attending: Urology | Admitting: Urology

## 2022-07-23 ENCOUNTER — Ambulatory Visit (INDEPENDENT_AMBULATORY_CARE_PROVIDER_SITE_OTHER): Payer: Medicare Other | Admitting: Urology

## 2022-07-23 VITALS — BP 110/63 | HR 102

## 2022-07-23 DIAGNOSIS — Z87440 Personal history of urinary (tract) infections: Secondary | ICD-10-CM

## 2022-07-23 DIAGNOSIS — R82998 Other abnormal findings in urine: Secondary | ICD-10-CM

## 2022-07-23 DIAGNOSIS — N200 Calculus of kidney: Secondary | ICD-10-CM

## 2022-07-23 DIAGNOSIS — N3001 Acute cystitis with hematuria: Secondary | ICD-10-CM

## 2022-07-23 LAB — URINALYSIS, ROUTINE W REFLEX MICROSCOPIC
Bilirubin, UA: NEGATIVE
Glucose, UA: NEGATIVE
Ketones, UA: NEGATIVE
Nitrite, UA: NEGATIVE
Specific Gravity, UA: 1.015 (ref 1.005–1.030)
Urobilinogen, Ur: 0.2 mg/dL (ref 0.2–1.0)
pH, UA: 6 (ref 5.0–7.5)

## 2022-07-23 LAB — MICROSCOPIC EXAMINATION
RBC, Urine: >30 /hpf — AB (ref 0–2)
WBC, UA: >30 /hpf — AB (ref 0–5)

## 2022-07-23 MED ORDER — CEFUROXIME AXETIL 500 MG PO TABS: 500.0000 mg | ORAL_TABLET | Freq: Two times a day (BID) | ORAL | 0 refills | Status: DC

## 2022-07-23 NOTE — Progress Notes (Signed)
07/23/2022 10:57 AM   Felicia Hogan 17-Apr-1945 [id number]  Referring provider: Alinda Deem, MD [address] [place],  [place] [address]  Followup recurrent UTI and Nephrolithiasis   HPI: Felicia Hogan is a 77yo here for followup for nephrolithiasis and recurrent UTI. KUB shows stable bilateral renal calculi. She denies nay flank pain. Her surgery for parathyroidectomy was cancelled pending further workup. UA today is concerning for infection.    PMH: Past Medical History:  Diagnosis Date   Acute pyelonephritis 01/24/2018   Anxiety    Arthritis    Depression    Diabetes mellitus without complication (HCC)    Dyslipidemia    History of kidney stones    Hypertension    Iron deficiency anemia due to chronic blood loss    Mucous membrane pemphigoid 2014   so far only infects mouth. treated with oral steroids for flares. Lidex as well.    Sleep apnea     Surgical History: Past Surgical History:  Procedure Laterality Date   BIOPSY  11/17/2017   Procedure: BIOPSY;  Surgeon: West Bali, MD;  [facility];  Service: Endoscopy;;  gastric duodenum   CESAREAN SECTION     twice   CHOLECYSTECTOMY     COLONOSCOPY  10/2012   Dr. Aleene Davidson: normal. due 10/2017.    COLONOSCOPY WITH PROPOFOL N/A 11/17/2017   Procedure: COLONOSCOPY WITH PROPOFOL;  Surgeon: West Bali, MD;  [facility]: [facility];  Service: Endoscopy;  Laterality: N/A;  8:45am   ESOPHAGOGASTRODUODENOSCOPY (EGD) WITH PROPOFOL N/A 11/17/2017   Procedure: ESOPHAGOGASTRODUODENOSCOPY (EGD) WITH PROPOFOL;  Surgeon: West Bali, MD;  [facility];  Service: Endoscopy;  Laterality: N/A;   GIVENS CAPSULE STUDY N/A 11/30/2017   Procedure: GIVENS CAPSULE STUDY;  Surgeon: West Bali, MD;  [facility]: [facility];  Service: Endoscopy;  Laterality: N/A;  7:30am   LITHOTRIPSY  2017   POLYPECTOMY  11/17/2017   Procedure: POLYPECTOMY;  Surgeon: West Bali, MD;  [facility]: [facility];  Service:  Endoscopy;;  colon    REPLACEMENT TOTAL KNEE BILATERAL     TONSILLECTOMY     TOTAL HIP ARTHROPLASTY Right     Home Medications:  Allergies as of 07/23/2022       Reactions   Nsaids Itching, Rash   If given in large doses.  If given in large doses.    Sulfa Antibiotics Rash   Other Hives, Itching, Swelling    : Tape, Other Reaction: hives. "cloth tape is better"   Erythromycin Hives, Rash   Tamsulosin Itching, Rash   Tape Itching, Rash   Tapentadol Itching, Rash        Medication List        Accurate as of Jul 23, 2022 10:57 AM. If you have any questions, ask your nurse or doctor.          acetaminophen 500 MG tablet Commonly known as: TYLENOL Take 1,000 mg by mouth 3 (three) times daily as needed for moderate pain or headache.   amLODipine 5 MG tablet Commonly known as: NORVASC Take 5 mg by mouth daily with supper.   atorvastatin 20 MG tablet Commonly known as: LIPITOR Take 10 mg by mouth daily with supper.   Benicar HCT 20-12.5 MG tablet Generic drug: olmesartan-hydrochlorothiazide Take 1 tablet by mouth daily.   cefUROXime 500 MG tablet Commonly known as: CEFTIN Take 1 tablet (500 mg total) by mouth 2 (two) times daily with a meal.   cetirizine 10  MG tablet Commonly known as: ZYRTEC Take 10 mg by mouth at bedtime as needed for allergies.   ciprofloxacin 500 MG tablet Commonly known as: CIPRO Take 1 tablet (500 mg total) by mouth every 12 (twelve) hours.   desvenlafaxine 50 MG 24 hr tablet Commonly known as: PRISTIQ Take 50 mg by mouth daily.   dexamethasone 0.5 MG/5ML solution Commonly known as: DECADRON Take 5 mLs by mouth 2 (two) times daily as needed (oral sores).   doxycycline 100 MG capsule Commonly known as: VIBRAMYCIN Take 1 capsule (100 mg total) by mouth every 12 (twelve) hours.   estradiol 0.1 MG/GM vaginal cream Commonly known as: ESTRACE SMARTSIG:Gram(s) Vaginal Twice a Week   famotidine 20 MG tablet Commonly known as:  PEPCID Take by mouth.   fenofibrate 160 MG tablet Take 160 mg by mouth daily with lunch.   fenofibrate micronized 200 MG capsule Commonly known as: LOFIBRA Take 200 mg by mouth daily.   Fish Oil 1200 MG Caps Take 1,200 mg by mouth 3 (three) times daily.   fluocinonide gel 0.05 % Commonly known as: LIDEX 1 application See admin instructions. Apply orally (dont swallow) 3 times daily as needed for mouth sores   Gemtesa 75 MG Tabs Generic drug: Vibegron Take 1 tablet by mouth daily.   ibuprofen 200 MG tablet Commonly known as: ADVIL Take 400 mg by mouth 3 (three) times daily as needed for headache or moderate pain.   indapamide 2.5 MG tablet Commonly known as: LOZOL Take 1 tablet (2.5 mg total) by mouth daily.   metFORMIN 1000 MG tablet Commonly known as: GLUCOPHAGE Take 500-1,000 mg by mouth See admin instructions. Take 1000 mg with breakfast, 500 mg with lunch, and 1000 mg with dinner   metFORMIN 500 MG 24 hr tablet Commonly known as: GLUCOPHAGE-XR Take 500 mg by mouth 3 (three) times daily.   Mounjaro 15 MG/0.5ML Pen Generic drug: tirzepatide Inject into the skin.   multivitamin tablet Take 1 tablet by mouth daily with lunch.   nitrofurantoin (macrocrystal-monohydrate) 100 MG capsule Commonly known as: MACROBID Take 1 capsule (100 mg total) by mouth every 12 (twelve) hours.   nitrofurantoin (macrocrystal-monohydrate) 100 MG capsule Commonly known as: MACROBID Take 1 capsule (100 mg total) by mouth 2 (two) times daily.   nitrofurantoin (macrocrystal-monohydrate) 100 MG capsule Commonly known as: MACROBID Take 1 capsule (100 mg total) by mouth every 12 (twelve) hours.   nitrofurantoin 50 MG capsule Commonly known as: Macrodantin Take 1 capsule (50 mg total) by mouth at bedtime. Take nightly after after completing 7 day of macrobid   omeprazole 20 MG capsule Commonly known as: PRILOSEC 1 PO 30 MINS PRIOR TO BREAKFAST.   PRESERVISION AREDS PO Take 1  capsule by mouth 2 (two) times daily.   ranitidine 150 MG tablet Commonly known as: ZANTAC Take 150 mg by mouth at bedtime as needed for heartburn.   SYSTANE ULTRA OP Place 1 drop into both eyes 4 (four) times daily as needed (dry eyes).   tamsulosin 0.4 MG Caps capsule Commonly known as: FLOMAX Take 0.4 mg by mouth daily as needed (kidney stone).   Vitamin D3 125 MCG (5000 UT) Tabs Take 5,000 Units by mouth daily with supper.        Allergies:  Allergies  Allergen Reactions   Nsaids Itching and Rash    If given in large doses.  If given in large doses.    Sulfa Antibiotics Rash   Other Hives, Itching and Swelling     :  Tape, Other Reaction: hives. "cloth tape is better"   Erythromycin Hives and Rash   Tamsulosin Itching and Rash   Tape Itching and Rash   Tapentadol Itching and Rash    Family History: Family History  Problem Relation Age of Onset   Liver cancer Mother        ?started in pancreas   Parkinson's disease Father    Pneumonia Father    Colon cancer Father 58       metastatic but did well   Thyroid cancer Father    Other Daughter 10       brain tumor, followed with serial MRIs. now age 18   Colon cancer Paternal Aunt 80    Social History:  reports that she quit smoking about 54 years ago. Her smoking use included cigarettes. She has a 0.25 pack-year smoking history. She has never used smokeless tobacco. She reports current alcohol use. She reports that she does not use drugs.  ROS: All other review of systems were reviewed and are negative except what is noted above in HPI  Physical Exam: BP 110/63   Pulse (!) 102   Constitutional:  Alert and oriented, No acute distress. HEENT: [place] AT, moist mucus membranes.  Trachea midline, no masses. Cardiovascular: No clubbing, cyanosis, or edema. Respiratory: Normal respiratory effort, no increased work of breathing. GI: Abdomen is soft, nontender, nondistended, no abdominal masses GU: No CVA tenderness.   Lymph: No cervical or inguinal lymphadenopathy. Skin: No rashes, bruises or suspicious lesions. Neurologic: Grossly intact, no focal deficits, moving all 4 extremities. Psychiatric: Normal mood and affect.  Laboratory Data: Lab Results  Component Value Date   WBC 13.6 (H) 01/27/2018   HGB 8.9 (L) 01/27/2018   HCT 29.6 (L) 01/27/2018   MCV 80.9 01/27/2018   PLT 336 01/27/2018    Lab Results  Component Value Date   CREATININE 0.96 01/25/2018    No results found for: "PSA"  No results found for: "TESTOSTERONE"  No results found for: "HGBA1C"  Urinalysis    Component Value Date/Time   COLORURINE AMBER (A) 01/24/2018 1820   APPEARANCEUR Cloudy (A) 06/30/2022 1220   LABSPEC 1.015 01/24/2018 1820   PHURINE 5.0 01/24/2018 1820   GLUCOSEU Negative 06/30/2022 1220   HGBUR MODERATE (A) 01/24/2018 1820   BILIRUBINUR Negative 06/30/2022 1220   KETONESUR 5 (A) 01/24/2018 1820   PROTEINUR 3+ (A) 06/30/2022 1220   PROTEINUR 100 (A) 01/24/2018 1820   NITRITE Positive (A) 06/30/2022 1220   NITRITE NEGATIVE 01/24/2018 1820   LEUKOCYTESUR 1+ (A) 06/30/2022 1220    Lab Results  Component Value Date   LABMICR See below: 06/30/2022   WBCUA >30 (A) 06/30/2022   LABEPIT 0-10 06/30/2022   BACTERIA Many (A) 06/30/2022    Pertinent Imaging: KUB today: Images reviewed and discussed with the patient  Results for orders placed during the hospital encounter of 04/14/22  Abdomen 1 view (KUB)  Narrative CLINICAL DATA:  Nephrolithiasis.  EXAM: ABDOMEN - 1 VIEW  COMPARISON:  January 10, 2022.  FINDINGS: The bowel gas pattern is normal. Grossly stable bilateral nephrolithiasis is noted, with 9 mm calculus on the right. At least 2 left-sided calculi are noted, the largest measuring 1 cm.  IMPRESSION: Grossly stable bilateral nephrolithiasis.   Electronically Signed By: Lupita Raider M.D. On: 04/14/2022 13:47  No results found for this or any previous visit.  No results  found for this or any previous visit.  No results found for this or any  previous visit.  No results found for this or any previous visit.  No valid procedures specified. No results found for this or any previous visit.  Results for orders placed during the hospital encounter of 01/24/18  CT Renal Stone Study  Narrative CLINICAL DATA:  History of UTI and flank pain, initial encounter  EXAM: CT ABDOMEN AND PELVIS WITHOUT CONTRAST  TECHNIQUE: Multidetector CT imaging of the abdomen and pelvis was performed following the standard protocol without IV contrast.  COMPARISON:  None.  FINDINGS: Lower chest: Lung bases demonstrate no focal infiltrate or sizable effusion. A 5 mm subpleural nodular density is noted in the left lower lobe best seen on image number 10 of series 4.  Hepatobiliary: No focal liver abnormality is seen. Status post cholecystectomy. No biliary dilatation.  Pancreas: Unremarkable. No pancreatic ductal dilatation or surrounding inflammatory changes.  Spleen: Normal in size without focal abnormality.  Adrenals/Urinary Tract: Adrenal glands are within normal limits bilaterally. The left kidney demonstrates a central renal pelvic stone measuring 12 mm. No obstructive changes are seen. Bladder is partially distended. Right kidney demonstrates significant perinephric stranding and appears somewhat enlarged when compared with the left. Tiny nonobstructing stone is noted in the right mid to lower pole. No significant hydronephrosis is noted although some thickening of the renal pelvis is seen. This may be related to the underlying UTI. No ureteral stone or obstructive changes are noted.  Stomach/Bowel: No obstructive or inflammatory changes of the bowel are seen. The appendix is within normal limits. Laxity of the anterior abdominal wall is seen with large panniculus anteriorly.  Vascular/Lymphatic: No significant vascular findings are present. No enlarged  abdominal or pelvic lymph nodes.  Reproductive: Uterus and bilateral adnexa are unremarkable.  Other: No abdominal wall hernia or abnormality. No abdominopelvic ascites.  Musculoskeletal: No acute or significant osseous findings. Right hip replacement is noted.  IMPRESSION: 12 mm nonobstructing left renal pelvic stone.  Tiny nonobstructing right renal stone.  Perinephric stranding as well as some enlargement of the right kidney and thickening of Gerota's fascia. This may be related to changes from recently passed stone. Possibility of pyelonephritis deserves consideration.  Left lower lobe 5 mm subpleural nodule. No follow-up needed if patient is low-risk. Non-contrast chest CT can be considered in 12 months if patient is high-risk. This recommendation follows the consensus statement: Guidelines for Management of Incidental Pulmonary Nodules Detected on CT Images: From the Fleischner Society 2017; Radiology 2017; 284:228-243.   Electronically Signed By: Alcide Clever M.D. On: 01/24/2018 19:45   Assessment & Plan:    1. Renal stone -followup 3 months with KUB - Urinalysis, Routine w reflex microscopic  2. Acute cystitis with hematuria -urine for culture. Ceftin 500mg  BID for 7 days -we will likely start trimethoprim prophylaxis versus a PCN - Urine Culture   No follow-ups on file.  Wilkie Aye, MD  [facility] [place]

## 2022-07-27 LAB — URINE CULTURE

## 2022-07-29 ENCOUNTER — Encounter: Payer: Self-pay | Admitting: Urology

## 2022-07-29 NOTE — Patient Instructions (Signed)

## 2022-08-11 ENCOUNTER — Telehealth: Payer: Self-pay

## 2022-08-11 DIAGNOSIS — N3001 Acute cystitis with hematuria: Secondary | ICD-10-CM

## 2022-08-11 MED ORDER — TRIMETHOPRIM 100 MG PO TABS: 100.0000 mg | ORAL_TABLET | Freq: Every evening | ORAL | 11 refills | Status: DC

## 2022-08-11 MED ORDER — DOXYCYCLINE HYCLATE 100 MG PO CAPS: 100.0000 mg | ORAL_CAPSULE | Freq: Two times a day (BID) | ORAL | 0 refills | Status: DC

## 2022-08-11 NOTE — Telephone Encounter (Signed)
Returned call to patient. Patient states she has developed another UTI.  Patient stays in [place] and request to drop off urine at the [facility] there.  Urinalysis and urine culture sent to [facility] at [address] [place], [place].

## 2022-08-11 NOTE — Telephone Encounter (Signed)
Patient returned call and made aware of both prescriptions sent to the pharmacy. Patient made aware to not start trimethoprim until after doxycycline is completed.

## 2022-08-11 NOTE — Telephone Encounter (Signed)
Per Dr. Ronne Binning patient is to start doxycyline 100 mg po bid and then start trimethoprim 100mg  QHS. Prescriptions sent to pharmacy Patient called with no answer. Message left to return call office.

## 2022-08-12 ENCOUNTER — Telehealth: Payer: Self-pay

## 2022-08-12 LAB — URINALYSIS, ROUTINE W REFLEX MICROSCOPIC
Bilirubin, UA: NEGATIVE
Glucose, UA: NEGATIVE
Ketones, UA: NEGATIVE
Nitrite, UA: NEGATIVE
Specific Gravity, UA: 1.015 (ref 1.005–1.030)
Urobilinogen, Ur: 0.2 mg/dL (ref 0.2–1.0)
pH, UA: 6.5 (ref 5.0–7.5)

## 2022-08-12 LAB — MICROSCOPIC EXAMINATION
Casts: NONE SEEN /lpf
RBC, Urine: >30 /hpf — AB (ref 0–2)
WBC, UA: >30 /hpf — AB (ref 0–5)

## 2022-08-12 NOTE — Telephone Encounter (Signed)
Patient wants to confirm that it is ok to take the trimethoprim rx with her allergy to sulfa.  She states the label recommends not taking with this allergy.  Patient has documented rash contradiction in chart.

## 2022-08-14 NOTE — Telephone Encounter (Signed)
Patient aware of MD response

## 2022-08-16 LAB — URINE CULTURE

## 2022-09-12 DIAGNOSIS — G8929 Other chronic pain: Secondary | ICD-10-CM | POA: Insufficient documentation

## 2022-10-22 ENCOUNTER — Ambulatory Visit (HOSPITAL_COMMUNITY)
Admission: RE | Admit: 2022-10-22 | Discharge: 2022-10-22 | Disposition: A | Discharge status: Home / Self Care | Payer: Medicare Other | Source: Ambulatory Visit | Attending: Urology | Admitting: Urology

## 2022-10-22 ENCOUNTER — Telehealth: Payer: Self-pay

## 2022-10-22 ENCOUNTER — Ambulatory Visit (INDEPENDENT_AMBULATORY_CARE_PROVIDER_SITE_OTHER): Payer: Medicare Other | Admitting: Urology

## 2022-10-22 VITALS — BP 115/59 | HR 100

## 2022-10-22 DIAGNOSIS — N200 Calculus of kidney: Secondary | ICD-10-CM | POA: Diagnosis not present

## 2022-10-22 DIAGNOSIS — N3001 Acute cystitis with hematuria: Secondary | ICD-10-CM | POA: Diagnosis not present

## 2022-10-22 LAB — URINALYSIS, ROUTINE W REFLEX MICROSCOPIC
Bilirubin, UA: NEGATIVE
Glucose, UA: NEGATIVE
Ketones, UA: NEGATIVE
Nitrite, UA: POSITIVE — AB
Specific Gravity, UA: 1.015 (ref 1.005–1.030)
Urobilinogen, Ur: 0.2 mg/dL (ref 0.2–1.0)
pH, UA: 6 (ref 5.0–7.5)

## 2022-10-22 LAB — MICROSCOPIC EXAMINATION
RBC, Urine: >30 /hpf — AB (ref 0–2)
WBC, UA: >30 /hpf — AB (ref 0–5)

## 2022-10-22 MED ORDER — CEFUROXIME AXETIL 250 MG PO TABS: 250.0000 mg | ORAL_TABLET | Freq: Every day | ORAL | 11 refills | Status: DC

## 2022-10-22 MED ORDER — CEFUROXIME AXETIL 500 MG PO TABS: 500.0000 mg | ORAL_TABLET | Freq: Two times a day (BID) | ORAL | 0 refills | Status: DC

## 2022-10-22 NOTE — Telephone Encounter (Signed)
Pharmacy staff left voice message on two prescription Ceftin. Pharmacy tech Marchelle Folks was made aware both prescription need to be fill. Marchelle Folks is made aware that patient will take Ceftin 500 mg BID X 7 days and Ceftin 250 mg nightly abt. [facility] pharmacy tech voiced understanding.

## 2022-10-22 NOTE — Progress Notes (Signed)
10/22/2022 10:26 AM   [name] 1946/01/23 [id number]  Referring provider: Alinda Deem, MD [address] [place],  [place] [address]  Followup nephrolithiasis and recurrent UTI  HPI: Ms Cremeans is a 77yo here for followup for recurrent UTI and nephrolithiasis. UA today is concerning for infection. Urine culture has been Klebsiella. She met with endocrine and treatment of her parathyroid has been deferred. KUb shows bilateral renal calculi, with greater stone burden on left   PMH: Past Medical History:  Diagnosis Date   Acute pyelonephritis 01/24/2018   Anxiety    Arthritis    Depression    Diabetes mellitus without complication (HCC)    Dyslipidemia    History of kidney stones    Hypertension    Iron deficiency anemia due to chronic blood loss    Mucous membrane pemphigoid 2014   so far only infects mouth. treated with oral steroids for flares. Lidex as well.    Sleep apnea     Surgical History: Past Surgical History:  Procedure Laterality Date   BIOPSY  11/17/2017   Procedure: BIOPSY;  Surgeon: West Bali, MD;  [facility]: [facility];  Service: Endoscopy;;  gastric duodenum   CESAREAN SECTION     twice   CHOLECYSTECTOMY     COLONOSCOPY  10/2012   Dr. Aleene Davidson: normal. due 10/2017.    COLONOSCOPY WITH PROPOFOL N/A 11/17/2017   Procedure: COLONOSCOPY WITH PROPOFOL;  Surgeon: West Bali, MD;  [facility]: [facility];  Service: Endoscopy;  Laterality: N/A;  8:45am   ESOPHAGOGASTRODUODENOSCOPY (EGD) WITH PROPOFOL N/A 11/17/2017   Procedure: ESOPHAGOGASTRODUODENOSCOPY (EGD) WITH PROPOFOL;  Surgeon: West Bali, MD;  [facility]: [facility];  Service: Endoscopy;  Laterality: N/A;   GIVENS CAPSULE STUDY N/A 11/30/2017   Procedure: GIVENS CAPSULE STUDY;  Surgeon: West Bali, MD;  [facility]: [facility];  Service: Endoscopy;  Laterality: N/A;  7:30am   LITHOTRIPSY  2017   POLYPECTOMY  11/17/2017   Procedure: POLYPECTOMY;  Surgeon: West Bali, MD;   [facility]: [facility];  Service: Endoscopy;;  colon    REPLACEMENT TOTAL KNEE BILATERAL     TONSILLECTOMY     TOTAL HIP ARTHROPLASTY Right     Home Medications:  Allergies as of 10/22/2022       Reactions   Nsaids Itching, Rash   If given in large doses.  If given in large doses.    Sulfa Antibiotics Rash   Other Hives, Itching, Swelling    : Tape, Other Reaction: hives. "cloth tape is better"   Erythromycin Hives, Rash   Tamsulosin Itching, Rash   Tape Itching, Rash   Tapentadol Itching, Rash        Medication List        Accurate as of October 22, 2022 10:26 AM. If you have any questions, ask your nurse or doctor.          acetaminophen 500 MG tablet Commonly known as: TYLENOL Take 1,000 mg by mouth 3 (three) times daily as needed for moderate pain or headache.   amLODipine 5 MG tablet Commonly known as: NORVASC Take 5 mg by mouth daily with supper.   atorvastatin 20 MG tablet Commonly known as: LIPITOR Take 10 mg by mouth daily with supper.   Benicar HCT 20-12.5 MG tablet Generic drug: olmesartan-hydrochlorothiazide Take 1 tablet by mouth daily.   cefUROXime 500 MG tablet Commonly known as: CEFTIN Take 1 tablet (500 mg total) by mouth 2 (two) times daily with  a meal.   cetirizine 10 MG tablet Commonly known as: ZYRTEC Take 10 mg by mouth at bedtime as needed for allergies.   ciprofloxacin 500 MG tablet Commonly known as: CIPRO Take 1 tablet (500 mg total) by mouth every 12 (twelve) hours.   desvenlafaxine 50 MG 24 hr tablet Commonly known as: PRISTIQ Take 50 mg by mouth daily.   dexamethasone 0.5 MG/5ML solution Commonly known as: DECADRON Take 5 mLs by mouth 2 (two) times daily as needed (oral sores).   doxycycline 100 MG capsule Commonly known as: VIBRAMYCIN Take 1 capsule (100 mg total) by mouth every 12 (twelve) hours.   doxycycline 100 MG capsule Commonly known as: VIBRAMYCIN Take 1 capsule (100 mg total) by mouth 2 (two) times  daily.   estradiol 0.1 MG/GM vaginal cream Commonly known as: ESTRACE SMARTSIG:Gram(s) Vaginal Twice a Week   famotidine 20 MG tablet Commonly known as: PEPCID Take by mouth.   fenofibrate 160 MG tablet Take 160 mg by mouth daily with lunch.   fenofibrate micronized 200 MG capsule Commonly known as: LOFIBRA Take 200 mg by mouth daily.   Fish Oil 1200 MG Caps Take 1,200 mg by mouth 3 (three) times daily.   fluocinonide gel 0.05 % Commonly known as: LIDEX 1 application See admin instructions. Apply orally (dont swallow) 3 times daily as needed for mouth sores   Gemtesa 75 MG Tabs Generic drug: Vibegron Take 1 tablet by mouth daily.   ibuprofen 200 MG tablet Commonly known as: ADVIL Take 400 mg by mouth 3 (three) times daily as needed for headache or moderate pain.   indapamide 2.5 MG tablet Commonly known as: LOZOL Take 1 tablet (2.5 mg total) by mouth daily.   metFORMIN 1000 MG tablet Commonly known as: GLUCOPHAGE Take 500-1,000 mg by mouth See admin instructions. Take 1000 mg with breakfast, 500 mg with lunch, and 1000 mg with dinner   metFORMIN 500 MG 24 hr tablet Commonly known as: GLUCOPHAGE-XR Take 500 mg by mouth 3 (three) times daily.   Mounjaro 15 MG/0.5ML Pen Generic drug: tirzepatide Inject into the skin.   multivitamin tablet Take 1 tablet by mouth daily with lunch.   nitrofurantoin (macrocrystal-monohydrate) 100 MG capsule Commonly known as: MACROBID Take 1 capsule (100 mg total) by mouth every 12 (twelve) hours.   nitrofurantoin (macrocrystal-monohydrate) 100 MG capsule Commonly known as: MACROBID Take 1 capsule (100 mg total) by mouth 2 (two) times daily.   nitrofurantoin (macrocrystal-monohydrate) 100 MG capsule Commonly known as: MACROBID Take 1 capsule (100 mg total) by mouth every 12 (twelve) hours.   nitrofurantoin 50 MG capsule Commonly known as: Macrodantin Take 1 capsule (50 mg total) by mouth at bedtime. Take nightly after after  completing 7 day of macrobid   omeprazole 20 MG capsule Commonly known as: PRILOSEC 1 PO 30 MINS PRIOR TO BREAKFAST.   PRESERVISION AREDS PO Take 1 capsule by mouth 2 (two) times daily.   ranitidine 150 MG tablet Commonly known as: ZANTAC Take 150 mg by mouth at bedtime as needed for heartburn.   SYSTANE ULTRA OP Place 1 drop into both eyes 4 (four) times daily as needed (dry eyes).   tamsulosin 0.4 MG Caps capsule Commonly known as: FLOMAX Take 0.4 mg by mouth daily as needed (kidney stone).   trimethoprim 100 MG tablet Commonly known as: TRIMPEX Take 1 tablet (100 mg total) by mouth at bedtime.   Vitamin D3 125 MCG (5000 UT) Tabs Take 5,000 Units by mouth daily with supper.  Allergies:  Allergies  Allergen Reactions   Nsaids Itching and Rash    If given in large doses.  If given in large doses.    Sulfa Antibiotics Rash   Other Hives, Itching and Swelling     : Tape, Other Reaction: hives. "cloth tape is better"   Erythromycin Hives and Rash   Tamsulosin Itching and Rash   Tape Itching and Rash   Tapentadol Itching and Rash    Family History: Family History  Problem Relation Age of Onset   Liver cancer Mother        ?started in pancreas   Parkinson's disease Father    Pneumonia Father    Colon cancer Father 44       metastatic but did well   Thyroid cancer Father    Other Daughter 10       brain tumor, followed with serial MRIs. now age 36   Colon cancer Paternal Aunt 41    Social History:  reports that she quit smoking about 54 years ago. Her smoking use included cigarettes. She started smoking about 55 years ago. She has a 0.3 pack-year smoking history. She has never used smokeless tobacco. She reports current alcohol use. She reports that she does not use drugs.  ROS: All other review of systems were reviewed and are negative except what is noted above in HPI  Physical Exam: BP (!) 115/59   Pulse 100   Constitutional:  Alert and  oriented, No acute distress. HEENT: [place] AT, moist mucus membranes.  Trachea midline, no masses. Cardiovascular: No clubbing, cyanosis, or edema. Respiratory: Normal respiratory effort, no increased work of breathing. GI: Abdomen is soft, nontender, nondistended, no abdominal masses GU: No CVA tenderness.  Lymph: No cervical or inguinal lymphadenopathy. Skin: No rashes, bruises or suspicious lesions. Neurologic: Grossly intact, no focal deficits, moving all 4 extremities. Psychiatric: Normal mood and affect.  Laboratory Data: Lab Results  Component Value Date   WBC 13.6 (H) 01/27/2018   HGB 8.9 (L) 01/27/2018   HCT 29.6 (L) 01/27/2018   MCV 80.9 01/27/2018   PLT 336 01/27/2018    Lab Results  Component Value Date   CREATININE 0.96 01/25/2018    No results found for: "PSA"  No results found for: "TESTOSTERONE"  No results found for: "HGBA1C"  Urinalysis    Component Value Date/Time   COLORURINE AMBER (A) 01/24/2018 1820   APPEARANCEUR Turbid (A) 08/11/2022 1143   LABSPEC 1.015 01/24/2018 1820   PHURINE 5.0 01/24/2018 1820   GLUCOSEU Negative 08/11/2022 1143   HGBUR MODERATE (A) 01/24/2018 1820   BILIRUBINUR Negative 08/11/2022 1143   KETONESUR 5 (A) 01/24/2018 1820   PROTEINUR 2+ (A) 08/11/2022 1143   PROTEINUR 100 (A) 01/24/2018 1820   NITRITE Negative 08/11/2022 1143   NITRITE NEGATIVE 01/24/2018 1820   LEUKOCYTESUR 3+ (A) 08/11/2022 1143    Lab Results  Component Value Date   LABMICR See below: 08/11/2022   WBCUA >30 (A) 08/11/2022   LABEPIT 0-10 08/11/2022   BACTERIA Few 08/11/2022    Pertinent Imaging: KUB today: Images reviewed and discussed with the patient  Results for orders placed during the hospital encounter of 07/23/22  Abdomen 1 view (KUB)  Narrative CLINICAL DATA:  Nephrolithiasis  EXAM: ABDOMEN - 1 VIEW  COMPARISON:  04/14/2022  FINDINGS: 13 x 9 mm calculus projects over upper pole of RIGHT kidney.  Tiny calculus inferior pole  RIGHT kidney.  Two calculi are project over LEFT kidney, 16 x 15 mm  at upper pole and 8 x 6 mm at mid to inferior kidney.  No ureteral calculi.  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  Increased stool in distal colon, bowel gas pattern otherwise normal.  IMPRESSION: BILATERAL renal calculi as above.   Electronically Signed By: Ulyses Southward M.D. On: 07/24/2022 15:21  No results found for this or any previous visit.  No results found for this or any previous visit.  No results found for this or any previous visit.  No results found for this or any previous visit.  No valid procedures specified. No results found for this or any previous visit.  Results for orders placed during the hospital encounter of 01/24/18  CT Renal Stone Study  Narrative CLINICAL DATA:  History of UTI and flank pain, initial encounter  EXAM: CT ABDOMEN AND PELVIS WITHOUT CONTRAST  TECHNIQUE: Multidetector CT imaging of the abdomen and pelvis was performed following the standard protocol without IV contrast.  COMPARISON:  None.  FINDINGS: Lower chest: Lung bases demonstrate no focal infiltrate or sizable effusion. A 5 mm subpleural nodular density is noted in the left lower lobe best seen on image number 10 of series 4.  Hepatobiliary: No focal liver abnormality is seen. Status post cholecystectomy. No biliary dilatation.  Pancreas: Unremarkable. No pancreatic ductal dilatation or surrounding inflammatory changes.  Spleen: Normal in size without focal abnormality.  Adrenals/Urinary Tract: Adrenal glands are within normal limits bilaterally. The left kidney demonstrates a central renal pelvic stone measuring 12 mm. No obstructive changes are seen. Bladder is partially distended. Right kidney demonstrates significant perinephric stranding and appears somewhat enlarged when compared with the left. Tiny nonobstructing stone is noted in the right mid to lower  pole. No significant hydronephrosis is noted although some thickening of the renal pelvis is seen. This may be related to the underlying UTI. No ureteral stone or obstructive changes are noted.  Stomach/Bowel: No obstructive or inflammatory changes of the bowel are seen. The appendix is within normal limits. Laxity of the anterior abdominal wall is seen with large panniculus anteriorly.  Vascular/Lymphatic: No significant vascular findings are present. No enlarged abdominal or pelvic lymph nodes.  Reproductive: Uterus and bilateral adnexa are unremarkable.  Other: No abdominal wall hernia or abnormality. No abdominopelvic ascites.  Musculoskeletal: No acute or significant osseous findings. Right hip replacement is noted.  IMPRESSION: 12 mm nonobstructing left renal pelvic stone.  Tiny nonobstructing right renal stone.  Perinephric stranding as well as some enlargement of the right kidney and thickening of Gerota's fascia. This may be related to changes from recently passed stone. Possibility of pyelonephritis deserves consideration.  Left lower lobe 5 mm subpleural nodule. No follow-up needed if patient is low-risk. Non-contrast chest CT can be considered in 12 months if patient is high-risk. This recommendation follows the consensus statement: Guidelines for Management of Incidental Pulmonary Nodules Detected on CT Images: From the Fleischner Society 2017; Radiology 2017; 284:228-243.   Electronically Signed By: Alcide Clever M.D. On: 01/24/2018 19:45   Assessment & Plan:    1. Renal stone -We discussed the management of kidney stones. These options include observation, ureteroscopy, shockwave lithotripsy (ESWL) and percutaneous nephrolithotomy (PCNL). We discussed which options are relevant to the patient's stone(s). We discussed the natural history of kidney stones as well as the complications of untreated stones and the impact on quality of life without treatment as  well as with each of the above listed treatments. We also discussed the efficacy of each treatment in  its ability to clear the stone burden. With any of these management options I discussed the signs and symptoms of infection and the need for emergent treatment should these be experienced. For each option we discussed the ability of each procedure to clear the patient of their stone burden.   For observation I described the risks which include but are not limited to silent renal damage, life-threatening infection, need for emergent surgery, failure to pass stone and pain.   For ureteroscopy I described the risks which include bleeding, infection, damage to contiguous structures, positioning injury, ureteral stricture, ureteral avulsion, ureteral injury, need for prolonged ureteral stent, inability to perform ureteroscopy, need for an interval procedure, inability to clear stone burden, stent discomfort/pain, heart attack, stroke, pulmonary embolus and the inherent risks with general anesthesia.   For shockwave lithotripsy I described the risks which include arrhythmia, kidney contusion, kidney hemorrhage, need for transfusion, pain, inability to adequately break up stone, inability to pass stone fragments, Steinstrasse, infection associated with obstructing stones, need for alternate surgical procedure, need for repeat shockwave lithotripsy, MI, CVA, PE and the inherent risks with anesthesia/conscious sedation.   For PCNL I described the risks including positioning injury, pneumothorax, hydrothorax, need for chest tube, inability to clear stone burden, renal laceration, arterial venous fistula or malformation, need for embolization of kidney, loss of kidney or renal function, need for repeat procedure, need for prolonged nephrostomy tube, ureteral avulsion, MI, CVA, PE and the inherent risks of general anesthesia.   - The patient would like to proceed with left ureteroscopic stone extraction - Urinalysis,  Routine w reflex microscopic  2. Acute cystitis with hematuria -urine for culture -ceftin 500mg  BID for 7 days then 250mg  daily until surgery   No follow-ups on file.  Wilkie Aye, MD  [facility] [place]

## 2022-10-23 ENCOUNTER — Encounter (HOSPITAL_COMMUNITY): Payer: Self-pay | Admitting: Emergency Medicine

## 2022-10-23 ENCOUNTER — Emergency Department (HOSPITAL_COMMUNITY): Payer: Medicare Other

## 2022-10-23 ENCOUNTER — Other Ambulatory Visit: Payer: Self-pay

## 2022-10-23 ENCOUNTER — Emergency Department (HOSPITAL_COMMUNITY)
Admission: EM | Admit: 2022-10-23 | Discharge: 2022-10-23 | Disposition: A | Discharge status: Home / Self Care | Payer: Medicare Other | Attending: Emergency Medicine | Admitting: Emergency Medicine

## 2022-10-23 DIAGNOSIS — R944 Abnormal results of kidney function studies: Secondary | ICD-10-CM | POA: Insufficient documentation

## 2022-10-23 DIAGNOSIS — W01198A Fall on same level from slipping, tripping and stumbling with subsequent striking against other object, initial encounter: Secondary | ICD-10-CM | POA: Diagnosis not present

## 2022-10-23 DIAGNOSIS — I959 Hypotension, unspecified: Secondary | ICD-10-CM | POA: Diagnosis not present

## 2022-10-23 DIAGNOSIS — Y92009 Unspecified place in unspecified non-institutional (private) residence as the place of occurrence of the external cause: Secondary | ICD-10-CM | POA: Insufficient documentation

## 2022-10-23 DIAGNOSIS — E860 Dehydration: Secondary | ICD-10-CM | POA: Diagnosis not present

## 2022-10-23 DIAGNOSIS — Z79899 Other long term (current) drug therapy: Secondary | ICD-10-CM | POA: Insufficient documentation

## 2022-10-23 DIAGNOSIS — R55 Syncope and collapse: Secondary | ICD-10-CM | POA: Diagnosis not present

## 2022-10-23 DIAGNOSIS — I10 Essential (primary) hypertension: Secondary | ICD-10-CM | POA: Diagnosis not present

## 2022-10-23 DIAGNOSIS — S0990XA Unspecified injury of head, initial encounter: Secondary | ICD-10-CM | POA: Diagnosis present

## 2022-10-23 DIAGNOSIS — S0003XA Contusion of scalp, initial encounter: Secondary | ICD-10-CM | POA: Diagnosis not present

## 2022-10-23 DIAGNOSIS — Z7984 Long term (current) use of oral hypoglycemic drugs: Secondary | ICD-10-CM | POA: Insufficient documentation

## 2022-10-23 LAB — CBC
HCT: 44.8 % (ref 36.0–46.0)
Hemoglobin: 14.6 g/dL (ref 12.0–15.0)
MCH: 28.3 pg (ref 26.0–34.0)
MCHC: 32.6 g/dL (ref 30.0–36.0)
MCV: 86.8 fL (ref 80.0–100.0)
Platelets: 327 10*3/uL (ref 150–400)
RBC: 5.16 MIL/uL — ABNORMAL HIGH (ref 3.87–5.11)
RDW: 14.4 % (ref 11.5–15.5)
WBC: 8.8 10*3/uL (ref 4.0–10.5)
nRBC: 0 % (ref 0.0–0.2)

## 2022-10-23 LAB — TROPONIN I (HIGH SENSITIVITY): Troponin I (High Sensitivity): 10 ng/L (ref <18)

## 2022-10-23 LAB — BASIC METABOLIC PANEL
Anion gap: 9 (ref 5–15)
BUN: 36 mg/dL — ABNORMAL HIGH (ref 8–23)
CO2: 29 mmol/L (ref 22–32)
Calcium: 10.1 mg/dL (ref 8.9–10.3)
Chloride: 100 mmol/L (ref 98–111)
Creatinine, Ser: 1.03 mg/dL — ABNORMAL HIGH (ref 0.44–1.00)
GFR, Estimated: 57 mL/min — ABNORMAL LOW (ref >60)
Glucose, Bld: 112 mg/dL — ABNORMAL HIGH (ref 70–99)
Potassium: 3.8 mmol/L (ref 3.5–5.1)
Sodium: 138 mmol/L (ref 135–145)

## 2022-10-23 LAB — URINALYSIS, ROUTINE W REFLEX MICROSCOPIC
Bilirubin Urine: NEGATIVE
Glucose, UA: NEGATIVE mg/dL
Ketones, ur: NEGATIVE mg/dL
Nitrite: NEGATIVE
Protein, ur: 100 mg/dL — AB
Specific Gravity, Urine: 1.01 (ref 1.005–1.030)
WBC, UA: >50 WBC/hpf (ref 0–5)
pH: 6 (ref 5.0–8.0)

## 2022-10-23 LAB — LACTIC ACID, PLASMA: Lactic Acid, Venous: 1.1 mmol/L (ref 0.5–1.9)

## 2022-10-23 LAB — BRAIN NATRIURETIC PEPTIDE: B Natriuretic Peptide: 20 pg/mL (ref 0.0–100.0)

## 2022-10-23 MED ORDER — SODIUM CHLORIDE 0.9 % IV BOLUS: 500.0000 mL | Freq: Once | INTRAVENOUS | Status: DC

## 2022-10-23 MED ORDER — SODIUM CHLORIDE 0.9 % IV BOLUS
1000.0000 mL | Freq: Once | INTRAVENOUS | Status: AC
  Administered 2022-10-23: 1000 mL via INTRAVENOUS

## 2022-10-23 MED ORDER — SODIUM CHLORIDE 0.9 % IV BOLUS
500.0000 mL | Freq: Once | INTRAVENOUS | Status: AC
  Administered 2022-10-23: 500 mL via INTRAVENOUS

## 2022-10-23 NOTE — ED Notes (Signed)
Diet tray ordered from dietary.

## 2022-10-23 NOTE — ED Provider Triage Note (Signed)
Emergency Medicine Provider Triage Evaluation Note  Felicia Hogan , a 77 y.o. female  was evaluated in triage.  Pt complains of LOC.  Patient states at 11 AM this morning she was in the kitchen when she got a wave of warmth that hit her with palpitations in which she had to sit down in 5 vascular they resolved.  Patient then stood up and had the same symptoms and felt and hit the right side of her head.  Patient states that she did lose consciousness for a few moments and that this was an unwitnessed fall.  Patient does not endorse any head laceration and states that she is asymptomatic now but wants to be evaluated.  Patient has any headache, vision changes, neck pain, chest pain, shortness of breath, change in sensation/motor skills.  Review of Systems  Positive: See HPI Negative: See HPI  Physical Exam  BP 111/73 (BP Location: Right Arm)   Pulse 85   Temp 98.9 F (37.2 C) (Oral)   Resp 18   Ht 5\' 5"  (1.651 m)   Wt 80.7 kg   SpO2 100%   BMI 29.62 kg/m  Gen:   Awake, no distress   Resp:  Normal effort  MSK:   Moves extremities without difficulty  Other:  Finger-to-nose test reassuring, coordination intact, equal symmetrical smile, equal strength and shoulder shrug, hematoma noted to right crown of head without laceration, no midline tenderness, pupils PERRL  Medical Decision Making  Medically screening exam initiated at 2:10 PM.  Appropriate orders placed.  Felicia Hogan was informed that the remainder of the evaluation will be completed by another provider, this initial triage assessment does not replace that evaluation, and the importance of remaining in the ED until their evaluation is complete.  Workup initiated, patient stable this time   Remi Deter 10/23/22 1412

## 2022-10-23 NOTE — Discharge Instructions (Addendum)
You were seen for passing out in the emergency department.   At home, please hold off on taking your amlodipine unless your blood pressure is above 140 systolic.    Check your MyChart online for the results of any tests that had not resulted by the time you left the emergency department.   Follow-up with your primary doctor in 2-3 days regarding your visit.  Please discuss with them your blood pressure medication.  Cardiology will call you about an appointment as well.  Return immediately to the emergency department if you experience any of the following: Chest pain, shortness of breath, fainting, or any other concerning symptoms.    Thank you for visiting our Emergency Department. It was a pleasure taking care of you today.

## 2022-10-23 NOTE — ED Provider Notes (Addendum)
  Physical Exam  BP (!) 94/51 (BP Location: Right Arm)   Pulse 73   Temp 98.1 F (36.7 C) (Oral)   Resp 16   Ht 5\' 5"  (1.651 m)   Wt 80.7 kg   SpO2 98%   BMI 29.62 kg/m   Physical Exam Vitals and nursing note reviewed.  Constitutional:      General: She is not in acute distress.    Appearance: She is well-developed.  HENT:     Head: Normocephalic and atraumatic.     Right Ear: External ear normal.     Left Ear: External ear normal.     Nose: Nose normal.  Eyes:     Extraocular Movements: Extraocular movements intact.     Conjunctiva/sclera: Conjunctivae normal.     Pupils: Pupils are equal, round, and reactive to light.  Cardiovascular:     Rate and Rhythm: Normal rate and regular rhythm.     Heart sounds: No murmur heard. Pulmonary:     Effort: Pulmonary effort is normal. No respiratory distress.     Breath sounds: Normal breath sounds.  Musculoskeletal:     Cervical back: Normal range of motion and neck supple.     Right lower leg: No edema.     Left lower leg: No edema.  Skin:    General: Skin is warm and dry.  Neurological:     Mental Status: She is alert and oriented to person, place, and time. Mental status is at baseline.  Psychiatric:        Mood and Affect: Mood normal.     Procedures  Procedures  ED Course / MDM    Medical Decision Making Amount and/or Complexity of Data Reviewed Labs: ordered. Radiology: ordered.   Assumed primary care of the patient after PA Burgess Amor signed her out to me for the day.  77 year old female with a history of hypertension on amlodipine who presented to the emergency department on with a syncopal episode and head strike.  No obvious signs of trauma on exam and did have CT of the head and C-spine that did not show any acute abnormalities.  Was noted to have a low blood pressure at home and has had some intermittent low blood pressures here in the emergency department.  Has been given 1.5 L of fluids and was observed for  several hours.  Blood pressure is now normalized.  She reports she is back to her baseline.  Unclear exactly what caused her symptoms but I do suspect that it is related to her blood pressure medications and her decreased p.o. intake and weight loss.  Counseled the patient to hold her amlodipine and to take her blood pressure every day and follow-up with her primary doctor in several days for repeat evaluation.  Was also given a cardiology referral.  EKG did not show any concerning findings that would warrant admission at this time and her lab work is also reassuring.    Rondel Baton, MD 10/24/22 [phone]

## 2022-10-23 NOTE — ED Provider Notes (Signed)
[place] EMERGENCY DEPARTMENT AT [facility] Provider Note   CSN: [id number] Arrival date & time: 10/23/22  1325     History  Chief Complaint  Patient presents with   Loss of Consciousness    Marja Adderley is a 77 y.o. female presenting for evaluation of presyncope and head injury which occurred midmorning today.  She states she had been in her kitchen completing chores, standing when she started to feel flushed, lightheaded and had sensation that she was going to pass out.  She went and she sat down in a chair and rested for about 10 minutes after which her symptoms completely resolved.  She got up to complete her chore and after several minutes of standing had a similar episode and with this episode she actually did pass out, as she woke laying on the floor, having hit her head on tile as she has a hematoma on her scalp, has a generalized headache but no other significant complaints at this time.  The syncopal episode was not witnessed, she was able to get herself off the floor and sat in a chair, she checked her blood pressure with the initial pressure being very low in the 70/40 range, she drank some water and second blood pressure obtained was in the 90 range.  She is on multiple medications for blood pressure including amlodipine and Lozol, also on Flomax secondary to chronic kidney stones associated with hypercalcemia with her hyperparathyroidism.  She states she has lost intentional weight over the past year and is concerned about her blood pressure medications being too strong for her.   The history is provided by the patient.       Home Medications Prior to Admission medications   Medication Sig Start Date End Date Taking? Authorizing Provider  acetaminophen (TYLENOL) 500 MG tablet Take 1,000 mg by mouth 3 (three) times daily as needed for moderate pain or headache.    [provider]  amLODipine (NORVASC) 5 MG tablet Take 5 mg by mouth daily with supper.   07/27/09   [provider]  atorvastatin (LIPITOR) 20 MG tablet Take 10 mg by mouth daily with supper.    [provider]  cefUROXime (CEFTIN) 250 MG tablet Take 1 tablet (250 mg total) by mouth at bedtime. 10/22/22   McKenzie, Mardene Celeste, MD  cefUROXime (CEFTIN) 500 MG tablet Take 1 tablet (500 mg total) by mouth 2 (two) times daily with a meal. 07/23/22   McKenzie, Mardene Celeste, MD  cefUROXime (CEFTIN) 500 MG tablet Take 1 tablet (500 mg total) by mouth 2 (two) times daily with a meal. 10/22/22   McKenzie, Mardene Celeste, MD  cetirizine (ZYRTEC) 10 MG tablet Take 10 mg by mouth at bedtime as needed for allergies.    [provider]  Cholecalciferol (VITAMIN D3) 5000 units TABS Take 5,000 Units by mouth daily with supper.     [provider]  ciprofloxacin (CIPRO) 500 MG tablet Take 1 tablet (500 mg total) by mouth every 12 (twelve) hours. 06/04/22   McKenzie, Mardene Celeste, MD  desvenlafaxine (PRISTIQ) 50 MG 24 hr tablet Take 50 mg by mouth daily.    [provider]  dexamethasone (DECADRON) 0.5 MG/5ML solution Take 5 mLs by mouth 2 (two) times daily as needed (oral sores).  10/12/15   [provider]  doxycycline (VIBRAMYCIN) 100 MG capsule Take 1 capsule (100 mg total) by mouth every 12 (twelve) hours. 06/30/22   McKenzie, Mardene Celeste, MD  doxycycline (VIBRAMYCIN) 100  MG capsule Take 1 capsule (100 mg total) by mouth 2 (two) times daily. 08/11/22   McKenzie, Mardene Celeste, MD  estradiol (ESTRACE) 0.1 MG/GM vaginal cream SMARTSIG:Gram(s) Vaginal Twice a Week 12/30/21   [provider]  famotidine (PEPCID) 20 MG tablet Take by mouth.    [provider]  fenofibrate 160 MG tablet Take 160 mg by mouth daily with lunch.  08/30/15   [provider]  fenofibrate micronized (LOFIBRA) 200 MG capsule Take 200 mg by mouth daily. 12/26/21   [provider]  fluocinonide gel (LIDEX) 0.05 % 1 application See admin instructions. Apply orally (dont swallow)  3 times daily as needed for mouth sores 12/31/09   [provider]  GEMTESA 75 MG TABS Take 1 tablet by mouth daily. 01/02/22   [provider]  ibuprofen (ADVIL,MOTRIN) 200 MG tablet Take 400 mg by mouth 3 (three) times daily as needed for headache or moderate pain.    [provider]  indapamide (LOZOL) 2.5 MG tablet Take 1 tablet (2.5 mg total) by mouth daily. 01/10/22   McKenzie, Mardene Celeste, MD  metFORMIN (GLUCOPHAGE) 1000 MG tablet Take 500-1,000 mg by mouth See admin instructions. Take 1000 mg with breakfast, 500 mg with lunch, and 1000 mg with dinner 05/22/15   [provider]  metFORMIN (GLUCOPHAGE-XR) 500 MG 24 hr tablet Take 500 mg by mouth 3 (three) times daily. 11/15/21   [provider]  MOUNJARO 15 MG/0.5ML Pen Inject into the skin. 11/26/21   [provider]  Multiple Vitamin (MULTIVITAMIN) tablet Take 1 tablet by mouth daily with lunch.     [provider]  Multiple Vitamins-Minerals (PRESERVISION AREDS PO) Take 1 capsule by mouth 2 (two) times daily.     [provider]  nitrofurantoin (MACRODANTIN) 50 MG capsule Take 1 capsule (50 mg total) by mouth at bedtime. Take nightly after after completing 7 day of macrobid 04/28/22   McKenzie, Mardene Celeste, MD  nitrofurantoin, macrocrystal-monohydrate, (MACROBID) 100 MG capsule Take 1 capsule (100 mg total) by mouth every 12 (twelve) hours. 04/14/22   McKenzie, Mardene Celeste, MD  nitrofurantoin, macrocrystal-monohydrate, (MACROBID) 100 MG capsule Take 1 capsule (100 mg total) by mouth 2 (two) times daily. 04/28/22   McKenzie, Mardene Celeste, MD  nitrofurantoin, macrocrystal-monohydrate, (MACROBID) 100 MG capsule Take 1 capsule (100 mg total) by mouth every 12 (twelve) hours. 05/30/22   McKenzie, Mardene Celeste, MD  olmesartan-hydrochlorothiazide (BENICAR HCT) 20-12.5 MG tablet Take 1 tablet by mouth daily.    [provider]  Omega-3 Fatty Acids (FISH OIL) 1200 MG CAPS Take 1,200 mg by mouth  3 (three) times daily.     [provider]  omeprazole (PRILOSEC) 20 MG capsule 1 PO 30 MINS PRIOR TO BREAKFAST. 11/17/17   Fields, Darleene Cleaver, MD  Polyethyl Glycol-Propyl Glycol (SYSTANE ULTRA OP) Place 1 drop into both eyes 4 (four) times daily as needed (dry eyes).    [provider]  ranitidine (ZANTAC) 150 MG tablet Take 150 mg by mouth at bedtime as needed for heartburn.     [provider]  tamsulosin (FLOMAX) 0.4 MG CAPS capsule Take 0.4 mg by mouth daily as needed (kidney stone).  03/19/16   [provider]  trimethoprim (TRIMPEX) 100 MG tablet Take 1 tablet (100 mg total) by mouth at bedtime. 08/11/22   McKenzie, Mardene Celeste, MD      Allergies    Nsaids, Sulfa antibiotics, Other, Erythromycin, Tamsulosin, Tape, and Tapentadol    Review of  Systems   Review of Systems  Constitutional:  Negative for chills and fever.  HENT:  Negative for congestion and sore throat.   Eyes: Negative.   Respiratory:  Negative for chest tightness and shortness of breath.   Cardiovascular:  Negative for chest pain.  Gastrointestinal:  Negative for abdominal pain, nausea and vomiting.  Genitourinary: Negative.   Musculoskeletal:  Negative for arthralgias, joint swelling and neck pain.  Skin: Negative.  Negative for rash and wound.  Neurological:  Positive for syncope and light-headedness. Negative for weakness, numbness and headaches.  Psychiatric/Behavioral: Negative.      Physical Exam Updated Vital Signs BP 111/73 (BP Location: Right Arm)   Pulse 85   Temp 98.9 F (37.2 C) (Oral)   Resp 18   Ht 5\' 5"  (1.651 m)   Wt 80.7 kg   SpO2 100%   BMI 29.62 kg/m  Physical Exam Vitals and nursing note reviewed.  Constitutional:      Appearance: She is well-developed.  HENT:     Head: Normocephalic.     Comments: Small hematoma right scalp.  No laceration. Eyes:     Extraocular Movements: Extraocular movements intact.     Conjunctiva/sclera: Conjunctivae normal.      Pupils: Pupils are equal, round, and reactive to light.  Cardiovascular:     Rate and Rhythm: Normal rate and regular rhythm.     Heart sounds: Normal heart sounds.  Pulmonary:     Effort: Pulmonary effort is normal.     Breath sounds: Normal breath sounds. No wheezing.  Abdominal:     General: Bowel sounds are normal.     Palpations: Abdomen is soft.     Tenderness: There is no abdominal tenderness.  Musculoskeletal:        General: Normal range of motion.     Cervical back: Normal range of motion.     Right lower leg: No edema.     Left lower leg: No edema.  Skin:    General: Skin is warm and dry.  Neurological:     General: No focal deficit present.     Mental Status: She is alert and oriented to person, place, and time.  Psychiatric:        Mood and Affect: Mood normal.     ED Results / Procedures / Treatments   Labs (all labs ordered are listed, but only abnormal results are displayed) Labs Reviewed  BASIC METABOLIC PANEL - Abnormal; Notable for the following components:      Result Value   Glucose, Bld 112 (*)    BUN 36 (*)    Creatinine, Ser 1.03 (*)    GFR, Estimated 57 (*)    All other components within normal limits  CBC - Abnormal; Notable for the following components:   RBC 5.16 (*)    All other components within normal limits  URINALYSIS, ROUTINE W REFLEX MICROSCOPIC  LACTIC ACID, PLASMA  LACTIC ACID, PLASMA  BRAIN NATRIURETIC PEPTIDE  TROPONIN I (HIGH SENSITIVITY)    EKG EKG Interpretation Date/Time:  Thursday October 23 2022 13:42:47 EDT Ventricular Rate:  86 PR Interval:  160 QRS Duration:  76 QT Interval:  358 QTC Calculation: 428 R Axis:   23  Text Interpretation: Normal sinus rhythm Cannot rule out Anterior infarct , age undetermined Abnormal ECG When compared with ECG of 13-Nov-2017 14:52, Nonspecific T wave abnormality no longer evident in Inferior leads Nonspecific T wave abnormality, worse in Lateral leads Confirmed by Vonita Moss  [phone]) on  10/23/2022 6:45:53 PM  Radiology CT Head Wo Contrast  Result Date: 10/23/2022 CLINICAL DATA:  Provided history: Fall.  Syncope. EXAM: CT HEAD WITHOUT CONTRAST CT CERVICAL SPINE WITHOUT CONTRAST TECHNIQUE: Multidetector CT imaging of the head and cervical spine was performed following the standard protocol without intravenous contrast. Multiplanar CT image reconstructions of the cervical spine were also generated. RADIATION DOSE REDUCTION: This exam was performed according to the departmental dose-optimization program which includes automated exposure control, adjustment of the mA and/or kV according to patient size and/or use of iterative reconstruction technique. COMPARISON:  None. FINDINGS: CT HEAD FINDINGS Brain: No age advanced or lobar predominant parenchymal atrophy. Patchy and ill-defined hypoattenuation within the cerebral white matter, nonspecific but compatible with mild chronic small vessel ischemic disease. There is no acute intracranial hemorrhage. No demarcated cortical infarct. No extra-axial fluid collection. No evidence of an intracranial mass. No midline shift. Vascular: No hyperdense vessel.  Atherosclerotic calcifications. Skull: No calvarial fracture or aggressive osseous lesion. Sinuses/Orbits: No mass or acute finding within the imaged orbits. Mild mucosal thickening or small-volume secretions within the left sphenoid sinus. Other: Small right parietal scalp hematoma. CT CERVICAL SPINE FINDINGS Alignment: Grade 1 anterolisthesis at C2-C3, C3-C4 and C4-C5. Grade 1 retrolisthesis at C5-C6. Grade 1 anterolisthesis at C7-T1 and T1-T2. Skull base and vertebrae: The basion-dental and atlanto-dental intervals are maintained.No evidence of acute fracture to the cervical spine. Soft tissues and spinal canal: No prevertebral fluid or swelling. No visible canal hematoma. Disc levels: Cervical spondylosis with multilevel disc space narrowing, disc bulges/central disc protrusions,  uncovertebral hypertrophy and facet arthrosis. Disc space narrowing is greatest at C3-C4, C4-C5 and C5-C6 (moderate to advanced at these levels) and C6-C7 (advanced at this level). At C5-C6, a posterior disc osteophyte complex mildly narrows the spinal canal. Multilevel bony neural foraminal narrowing. Multilevel ventral osteophytes. Degenerative changes are also present at the C1-C2 articulation. Upper chest: No consolidation within the imaged lung apices. No visible pneumothorax. IMPRESSION: CT head: 1. No evidence of an acute intracranial abnormality. 2. Small right parietal scalp hematoma. 3. Mild chronic small vessel ischemic changes within the cerebral white matter. 4. Mild left sphenoid sinus disease. CT cervical spine: 1. No evidence of an acute cervical spine fracture. 2. 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 3. Cervical spondylosis as described. Electronically Signed   By: Jackey Loge D.O.   On: 10/23/2022 16:53   CT Cervical Spine Wo Contrast  Result Date: 10/23/2022 CLINICAL DATA:  Provided history: Fall.  Syncope. EXAM: CT HEAD WITHOUT CONTRAST CT CERVICAL SPINE WITHOUT CONTRAST TECHNIQUE: Multidetector CT imaging of the head and cervical spine was performed following the standard protocol without intravenous contrast. Multiplanar CT image reconstructions of the cervical spine were also generated. RADIATION DOSE REDUCTION: This exam was performed according to the departmental dose-optimization program which includes automated exposure control, adjustment of the mA and/or kV according to patient size and/or use of iterative reconstruction technique. COMPARISON:  None. FINDINGS: CT HEAD FINDINGS Brain: No age advanced or lobar predominant parenchymal atrophy. Patchy and ill-defined hypoattenuation within the cerebral white matter, nonspecific but compatible with mild chronic small vessel ischemic disease. There is no acute intracranial hemorrhage. No demarcated  cortical infarct. No extra-axial fluid collection. No evidence of an intracranial mass. No midline shift. Vascular: No hyperdense vessel.  Atherosclerotic calcifications. Skull: No calvarial fracture or aggressive osseous lesion. Sinuses/Orbits: No mass or acute finding within the imaged orbits. Mild mucosal thickening or small-volume secretions within the left sphenoid  sinus. Other: Small right parietal scalp hematoma. CT CERVICAL SPINE FINDINGS Alignment: Grade 1 anterolisthesis at C2-C3, C3-C4 and C4-C5. Grade 1 retrolisthesis at C5-C6. Grade 1 anterolisthesis at C7-T1 and T1-T2. Skull base and vertebrae: The basion-dental and atlanto-dental intervals are maintained.No evidence of acute fracture to the cervical spine. Soft tissues and spinal canal: No prevertebral fluid or swelling. No visible canal hematoma. Disc levels: Cervical spondylosis with multilevel disc space narrowing, disc bulges/central disc protrusions, uncovertebral hypertrophy and facet arthrosis. Disc space narrowing is greatest at C3-C4, C4-C5 and C5-C6 (moderate to advanced at these levels) and C6-C7 (advanced at this level). At C5-C6, a posterior disc osteophyte complex mildly narrows the spinal canal. Multilevel bony neural foraminal narrowing. Multilevel ventral osteophytes. Degenerative changes are also present at the C1-C2 articulation. Upper chest: No consolidation within the imaged lung apices. No visible pneumothorax. IMPRESSION: CT head: 1. No evidence of an acute intracranial abnormality. 2. Small right parietal scalp hematoma. 3. Mild chronic small vessel ischemic changes within the cerebral white matter. 4. Mild left sphenoid sinus disease. CT cervical spine: 1. No evidence of an acute cervical spine fracture. 2. 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 3. Cervical spondylosis as described. Electronically Signed   By: Jackey Loge D.O.   On: 10/23/2022 16:53    Procedures Procedures     Medications Ordered in ED Medications  sodium chloride 0.9 % bolus 500 mL (has no administration in time range)  sodium chloride 0.9 % bolus 1,000 mL (1,000 mLs Intravenous Bolus 10/23/22 1705)    ED Course/ Medical Decision Making/ A&P                                 Medical Decision Making Patient presenting with a syncopal event this morning at home, fell hitting her head, has a hematoma, CT imaging reassuring with no intracranial or cervical injury.  She has been asymptomatic here except for mild headache, elevated BUN and mildly elevated creatinine suggesting possible dehydration therefore she was given IV fluids here.  She also was until positive with her orthostatic vital signs, although she did not have symptoms, she did have a drop in her blood pressure with standing.  She has been maintained on cardiac monitor while here, no ectopy or arrhythmias noted.  At reassessment and after having received IV fluids, blood pressure has once again settled into the mid 80 range systolic although she denies any symptoms at this time other than hunger, stating she had not had any food since breakfast this morning.  Patient was discussed with Dr. Eloise Harman who is added additional labs and assumes care of patient.  Patient may need admission if blood pressure is not responding to additional IV fluids which have been ordered.  Amount and/or Complexity of Data Reviewed Labs: ordered.    Details: Labs revealing for a mild dehydration with a BUN of 36, creatinine of 1.03.  Her WBC count is normal at 8.8, hemoglobin is 14.6 and normal. Radiology: ordered.    Details: CT head and C-spine negative for acute intracranial findings or cervical injury from fall.           Final Clinical Impression(s) / ED Diagnoses Final diagnoses:  Syncope, unspecified syncope type  Hypotension, unspecified hypotension type    Rx / DC Orders ED Discharge Orders     None         Victoriano Lain 10/23/22  1858    Rondel Baton, MD 10/24/22 [phone]

## 2022-10-23 NOTE — ED Triage Notes (Signed)
Pt reports syncopal episode, reports hit her head on ceramic tile; denies being on thinners

## 2022-10-28 ENCOUNTER — Telehealth: Payer: Self-pay

## 2022-10-28 NOTE — Telephone Encounter (Signed)
-----   Message from Bertram Millard Dahlstedt sent at 10/28/2022  1:03 PM EDT ----- -The culture was positive, I think that the Ceftin sent in should work. ----- Message ----- From: Gustavus Messing, LPN Sent: [phone]/[id number]  11:26 AM EDT To: Marcine Matar, MD; Malen Gauze, MD  Please review in Dr. Dimas Millin absence. Patient was started on Ceftin.

## 2022-10-28 NOTE — Telephone Encounter (Signed)
Called patient to follow up on symptoms. Patient states urine looks a lot better but she still has some burning. Patient informed of azo and states she knows about the medication and will get some if she feels the need. Patient states she has another day or two left of the antibiotic to take and will call office if her symptoms worsen.

## 2022-11-13 ENCOUNTER — Encounter: Payer: Self-pay | Admitting: Urology

## 2022-11-13 NOTE — Patient Instructions (Signed)

## 2022-11-14 ENCOUNTER — Telehealth: Payer: Self-pay

## 2022-11-14 DIAGNOSIS — N200 Calculus of kidney: Secondary | ICD-10-CM

## 2022-11-14 DIAGNOSIS — R310 Gross hematuria: Secondary | ICD-10-CM

## 2022-11-14 DIAGNOSIS — R300 Dysuria: Secondary | ICD-10-CM

## 2022-11-14 DIAGNOSIS — N3001 Acute cystitis with hematuria: Secondary | ICD-10-CM

## 2022-11-14 MED ORDER — CIPROFLOXACIN HCL 500 MG PO TABS
500.0000 mg | ORAL_TABLET | Freq: Two times a day (BID) | ORAL | 0 refills | Status: DC
Start: 2022-11-14 — End: 2022-12-04

## 2022-11-14 NOTE — Telephone Encounter (Signed)
Patient call she states that she saw Dr. Ronne Binning on 07/30. Patient states she has an infection and was put on ABT, patient states she not doing any better and is scheduled to have surgery on 09/12 and wants to know what her next steps would be. Patient is aware a message will be sent to Dr. Ronne Binning on recommendation. Voiced understanding

## 2022-11-14 NOTE — Telephone Encounter (Signed)
Patient is made aware of Dr. Ronne Binning recommendation "Please send cipro 500mg  BID fro 7 days" Voiced understanding

## 2022-11-26 ENCOUNTER — Ambulatory Visit: Payer: Medicare Other

## 2022-11-26 ENCOUNTER — Telehealth: Payer: Self-pay

## 2022-11-26 DIAGNOSIS — N200 Calculus of kidney: Secondary | ICD-10-CM

## 2022-11-26 LAB — URINALYSIS, ROUTINE W REFLEX MICROSCOPIC
Bilirubin, UA: NEGATIVE
Glucose, UA: NEGATIVE
Ketones, UA: NEGATIVE
Nitrite, UA: NEGATIVE
Specific Gravity, UA: 1.01 (ref 1.005–1.030)
Urobilinogen, Ur: 0.2 mg/dL (ref 0.2–1.0)
pH, UA: 6.5 (ref 5.0–7.5)

## 2022-11-26 LAB — MICROSCOPIC EXAMINATION: WBC, UA: >30 /HPF — AB (ref 0–5)

## 2022-11-26 MED ORDER — CIPROFLOXACIN HCL 500 MG PO TABS
500.0000 mg | ORAL_TABLET | Freq: Two times a day (BID) | ORAL | 0 refills | Status: DC
Start: 2022-11-26 — End: 2022-12-04

## 2022-11-26 NOTE — Telephone Encounter (Signed)
Patient called office today to have UA/UC done 1 week prior to surgery as discussed with Dr. Ronne Binning

## 2022-11-26 NOTE — Progress Notes (Addendum)
Patient presents today urine drop prior to surgery .  UA and Culture done today.  Dr. Ronne Binning reviewed results and Cipro 500 mg bid .  Patient aware of MD recommendations and that we will reach out with culture results.      [id number], CMA

## 2022-11-30 LAB — URINE CULTURE

## 2022-12-01 ENCOUNTER — Encounter (HOSPITAL_COMMUNITY)
Admission: RE | Admit: 2022-12-01 | Discharge: 2022-12-01 | Disposition: A | Discharge status: Home / Self Care | Payer: Medicare Other | Source: Ambulatory Visit | Attending: Urology | Admitting: Urology

## 2022-12-04 ENCOUNTER — Encounter (HOSPITAL_COMMUNITY): Payer: Self-pay | Admitting: Urology

## 2022-12-04 ENCOUNTER — Ambulatory Visit (HOSPITAL_COMMUNITY)
Admission: RE | Admit: 2022-12-04 | Discharge: 2022-12-04 | Disposition: A | Discharge status: Home / Self Care | Payer: Medicare Other | Attending: Urology | Admitting: Urology

## 2022-12-04 ENCOUNTER — Ambulatory Visit (HOSPITAL_COMMUNITY): Payer: Medicare Other | Admitting: Certified Registered"

## 2022-12-04 ENCOUNTER — Ambulatory Visit (HOSPITAL_COMMUNITY): Payer: Medicare Other

## 2022-12-04 ENCOUNTER — Ambulatory Visit (HOSPITAL_BASED_OUTPATIENT_CLINIC_OR_DEPARTMENT_OTHER): Payer: Medicare Other | Admitting: Certified Registered"

## 2022-12-04 ENCOUNTER — Encounter (HOSPITAL_COMMUNITY)
Admission: RE | Disposition: A | Discharge status: Home / Self Care | Payer: Self-pay | Source: Home / Self Care | Attending: Urology

## 2022-12-04 DIAGNOSIS — F32A Depression, unspecified: Secondary | ICD-10-CM | POA: Insufficient documentation

## 2022-12-04 DIAGNOSIS — D649 Anemia, unspecified: Secondary | ICD-10-CM | POA: Insufficient documentation

## 2022-12-04 DIAGNOSIS — N201 Calculus of ureter: Secondary | ICD-10-CM | POA: Diagnosis present

## 2022-12-04 DIAGNOSIS — I10 Essential (primary) hypertension: Secondary | ICD-10-CM | POA: Insufficient documentation

## 2022-12-04 DIAGNOSIS — N202 Calculus of kidney with calculus of ureter: Secondary | ICD-10-CM | POA: Diagnosis not present

## 2022-12-04 DIAGNOSIS — Z87442 Personal history of urinary calculi: Secondary | ICD-10-CM | POA: Insufficient documentation

## 2022-12-04 DIAGNOSIS — G4730 Sleep apnea, unspecified: Secondary | ICD-10-CM | POA: Diagnosis not present

## 2022-12-04 DIAGNOSIS — Z87891 Personal history of nicotine dependence: Secondary | ICD-10-CM | POA: Diagnosis not present

## 2022-12-04 DIAGNOSIS — F419 Anxiety disorder, unspecified: Secondary | ICD-10-CM | POA: Insufficient documentation

## 2022-12-04 DIAGNOSIS — E119 Type 2 diabetes mellitus without complications: Secondary | ICD-10-CM | POA: Insufficient documentation

## 2022-12-04 DIAGNOSIS — Z7984 Long term (current) use of oral hypoglycemic drugs: Secondary | ICD-10-CM | POA: Insufficient documentation

## 2022-12-04 HISTORY — PX: CYSTOSCOPY WITH RETROGRADE PYELOGRAM, URETEROSCOPY AND STENT PLACEMENT: SHX5789

## 2022-12-04 HISTORY — PX: HOLMIUM LASER APPLICATION: SHX5852

## 2022-12-04 LAB — GLUCOSE, CAPILLARY: Glucose-Capillary: 99 mg/dL (ref 70–99)

## 2022-12-04 SURGERY — CYSTOURETEROSCOPY, WITH RETROGRADE PYELOGRAM AND STENT INSERTION
Anesthesia: General | Laterality: Left

## 2022-12-04 MED ORDER — PROPOFOL 10 MG/ML IV BOLUS
INTRAVENOUS | Status: AC
  Filled 2022-12-04: qty 20

## 2022-12-04 MED ORDER — LIDOCAINE HCL (PF) 2 % IJ SOLN
INTRAMUSCULAR | Status: AC
  Filled 2022-12-04: qty 5

## 2022-12-04 MED ORDER — DIATRIZOATE MEGLUMINE 30 % UR SOLN
URETHRAL | Status: DC | PRN
  Administered 2022-12-04: 10 mL via URETHRAL

## 2022-12-04 MED ORDER — PROPOFOL 10 MG/ML IV BOLUS
INTRAVENOUS | Status: DC | PRN
Start: 2022-12-04 — End: 2022-12-04
  Administered 2022-12-04: 180 mg via INTRAVENOUS

## 2022-12-04 MED ORDER — CEFPODOXIME PROXETIL 200 MG PO TABS: 200.0000 mg | ORAL_TABLET | Freq: Two times a day (BID) | ORAL | 0 refills | Status: DC

## 2022-12-04 MED ORDER — LIDOCAINE HCL (CARDIAC) PF 100 MG/5ML IV SOSY
PREFILLED_SYRINGE | INTRAVENOUS | Status: DC | PRN
  Administered 2022-12-04: 80 mg via INTRAVENOUS

## 2022-12-04 MED ORDER — SODIUM CHLORIDE 0.9 % IV SOLN
INTRAVENOUS | Status: DC | PRN
Start: 2022-12-04 — End: 2022-12-04

## 2022-12-04 MED ORDER — PHENYLEPHRINE 80 MCG/ML (10ML) SYRINGE FOR IV PUSH (FOR BLOOD PRESSURE SUPPORT)
PREFILLED_SYRINGE | INTRAVENOUS | Status: AC
  Filled 2022-12-04: qty 10

## 2022-12-04 MED ORDER — ONABOTULINUMTOXINA 100 UNITS IJ SOLR
INTRAMUSCULAR | Status: AC
  Filled 2022-12-04: qty 100

## 2022-12-04 MED ORDER — ONDANSETRON HCL 4 MG/2ML IJ SOLN
INTRAMUSCULAR | Status: DC | PRN
  Administered 2022-12-04: 4 mg via INTRAVENOUS

## 2022-12-04 MED ORDER — OXYCODONE-ACETAMINOPHEN 5-325 MG PO TABS
1.0000 | ORAL_TABLET | ORAL | 0 refills | Status: DC | PRN
Start: 2022-12-04 — End: 2023-01-05

## 2022-12-04 MED ORDER — ONDANSETRON HCL 4 MG/2ML IJ SOLN
INTRAMUSCULAR | Status: AC
  Filled 2022-12-04: qty 2

## 2022-12-04 MED ORDER — WATER FOR IRRIGATION, STERILE IR SOLN
Status: DC | PRN
  Administered 2022-12-04: 1000 mL

## 2022-12-04 MED ORDER — VASOPRESSIN 20 UNIT/ML IV SOLN
INTRAVENOUS | Status: DC | PRN
Start: 2022-12-04 — End: 2022-12-04
  Administered 2022-12-04 (×2): 1 [IU] via INTRAVENOUS

## 2022-12-04 MED ORDER — OXYCODONE HCL 5 MG PO TABS: 5.0000 mg | ORAL_TABLET | Freq: Once | ORAL | Status: DC | PRN

## 2022-12-04 MED ORDER — PHENYLEPHRINE 80 MCG/ML (10ML) SYRINGE FOR IV PUSH (FOR BLOOD PRESSURE SUPPORT)
PREFILLED_SYRINGE | INTRAVENOUS | Status: DC | PRN
Start: 2022-12-04 — End: 2022-12-04
  Administered 2022-12-04: 80 ug via INTRAVENOUS
  Administered 2022-12-04: 160 ug via INTRAVENOUS
  Administered 2022-12-04: 80 ug via INTRAVENOUS

## 2022-12-04 MED ORDER — DEXAMETHASONE SODIUM PHOSPHATE 10 MG/ML IJ SOLN
INTRAMUSCULAR | Status: DC | PRN
  Administered 2022-12-04: 4 mg via INTRAVENOUS

## 2022-12-04 MED ORDER — FENTANYL CITRATE (PF) 100 MCG/2ML IJ SOLN
INTRAMUSCULAR | Status: AC
  Filled 2022-12-04: qty 2

## 2022-12-04 MED ORDER — LACTATED RINGERS IV SOLN: INTRAVENOUS | Status: DC | PRN

## 2022-12-04 MED ORDER — FENTANYL CITRATE (PF) 250 MCG/5ML IJ SOLN
INTRAMUSCULAR | Status: DC | PRN
  Administered 2022-12-04 (×4): 25 ug via INTRAVENOUS

## 2022-12-04 MED ORDER — VASOPRESSIN 20 UNIT/ML IV SOLN
INTRAVENOUS | Status: AC
  Filled 2022-12-04: qty 1

## 2022-12-04 MED ORDER — OXYCODONE HCL 5 MG/5ML PO SOLN: 5.0000 mg | Freq: Once | ORAL | Status: DC | PRN

## 2022-12-04 MED ORDER — EPHEDRINE SULFATE-NACL 50-0.9 MG/10ML-% IV SOSY
PREFILLED_SYRINGE | INTRAVENOUS | Status: DC | PRN
Start: 2022-12-04 — End: 2022-12-04
  Administered 2022-12-04 (×2): 10 mg via INTRAVENOUS
  Administered 2022-12-04: 5 mg via INTRAVENOUS

## 2022-12-04 MED ORDER — SODIUM CHLORIDE 0.9 % IV SOLN
1.0000 g | INTRAVENOUS | Status: AC
  Administered 2022-12-04: 1 g via INTRAVENOUS
  Filled 2022-12-04: qty 10

## 2022-12-04 MED ORDER — DEXAMETHASONE SODIUM PHOSPHATE 10 MG/ML IJ SOLN
INTRAMUSCULAR | Status: AC
  Filled 2022-12-04: qty 1

## 2022-12-04 MED ORDER — ONDANSETRON HCL 4 MG/2ML IJ SOLN: 4.0000 mg | Freq: Once | INTRAMUSCULAR | Status: DC | PRN

## 2022-12-04 MED ORDER — ONABOTULINUMTOXINA 100 UNITS IJ SOLR: 200.0000 [IU] | Freq: Once | INTRAMUSCULAR | Status: DC

## 2022-12-04 MED ORDER — DIATRIZOATE MEGLUMINE 30 % UR SOLN
URETHRAL | Status: AC
  Filled 2022-12-04: qty 100

## 2022-12-04 MED ORDER — FENTANYL CITRATE PF 50 MCG/ML IJ SOSY: 25.0000 ug | PREFILLED_SYRINGE | INTRAMUSCULAR | Status: DC | PRN

## 2022-12-04 MED ORDER — SODIUM CHLORIDE 0.9 % IR SOLN
Status: DC | PRN
  Administered 2022-12-04: 6000 mL via INTRAVESICAL

## 2022-12-04 MED ORDER — SODIUM CHLORIDE (PF) 0.9 % IJ SOLN
INTRAMUSCULAR | Status: AC
  Filled 2022-12-04: qty 20

## 2022-12-04 MED ORDER — EPHEDRINE 5 MG/ML INJ
INTRAVENOUS | Status: AC
  Filled 2022-12-04: qty 5

## 2022-12-04 SURGICAL SUPPLY — 30 items
BAG DRAIN URO TABLE W/ADPT NS (BAG) ×2 IMPLANT
BAG DRN 8 ADPR NS SKTRN CSTL (BAG) ×2
BAG HAMPER (MISCELLANEOUS) ×2 IMPLANT
CATH INTERMIT  6FR 70CM (CATHETERS) ×2 IMPLANT
CLOTH BEACON ORANGE TIMEOUT ST (SAFETY) ×2 IMPLANT
DECANTER SPIKE VIAL GLASS SM (MISCELLANEOUS) ×1 IMPLANT
EXTRACTOR STONE NITINOL NGAGE (UROLOGICAL SUPPLIES) IMPLANT
GLOVE BIO SURGEON STRL SZ8 (GLOVE) ×2 IMPLANT
GLOVE BIOGEL PI IND STRL 7.0 (GLOVE) ×4 IMPLANT
GOWN STRL REUS W/TWL LRG LVL3 (GOWN DISPOSABLE) ×2 IMPLANT
GOWN STRL REUS W/TWL XL LVL3 (GOWN DISPOSABLE) ×2 IMPLANT
GUIDEWIRE STR DUAL SENSOR (WIRE) ×2 IMPLANT
GUIDEWIRE STR ZIPWIRE 035X150 (MISCELLANEOUS) ×2 IMPLANT
IV NS IRRIG 3000ML ARTHROMATIC (IV SOLUTION) ×4 IMPLANT
KIT TURNOVER CYSTO (KITS) ×2 IMPLANT
MANIFOLD NEPTUNE II (INSTRUMENTS) ×2 IMPLANT
NDL ASPIRATION 22 (NEEDLE) IMPLANT
NEEDLE ASPIRATION 22 (NEEDLE)
PACK CYSTO (CUSTOM PROCEDURE TRAY) ×2 IMPLANT
PAD ARMBOARD 7.5X6 YLW CONV (MISCELLANEOUS) ×2 IMPLANT
POSITIONER HEAD 8X9X4 ADT (SOFTGOODS) ×2 IMPLANT
SHEATH NAVIGATOR HD 11/13X36 (SHEATH) ×1 IMPLANT
SHEATH URETERAL 12FRX35CM (MISCELLANEOUS) ×1 IMPLANT
STENT URET 6FRX26 CONTOUR (STENTS) ×1 IMPLANT
SYR 10ML LL (SYRINGE) ×2 IMPLANT
SYR CONTROL 10ML LL (SYRINGE) ×2 IMPLANT
TOWEL OR 17X26 4PK STRL BLUE (TOWEL DISPOSABLE) ×2 IMPLANT
TRACTIP FLEXIVA PULS ID 200XHI (Laser) ×1 IMPLANT
TRACTIP FLEXIVA PULSE ID 200 (Laser) ×2
WATER STERILE IRR 500ML POUR (IV SOLUTION) ×2 IMPLANT

## 2022-12-04 NOTE — H&P (Signed)
HPI: Felicia Hogan is a 77yo here for left ureteroscopic stone extraction. Urine culture has been Klebsiella. She met with endocrine and treatment of her parathyroid has been deferred. KUb shows bilateral renal calculi, with greater stone burden on left     PMH:     Past Medical History:  Diagnosis Date   Acute pyelonephritis 01/24/2018   Anxiety     Arthritis     Depression     Diabetes mellitus without complication (HCC)     Dyslipidemia     History of kidney stones     Hypertension     Iron deficiency anemia due to chronic blood loss     Mucous membrane pemphigoid 2014    so far only infects mouth. treated with oral steroids for flares. Lidex as well.    Sleep apnea            Surgical History:      Past Surgical History:  Procedure Laterality Date   BIOPSY   11/17/2017    Procedure: BIOPSY;  Surgeon: West Bali, MD;  Location: [facility];  Service: Endoscopy;;  gastric duodenum   CESAREAN SECTION        twice   CHOLECYSTECTOMY       COLONOSCOPY   10/2012    Dr. Aleene Davidson: normal. due 10/2017.    COLONOSCOPY WITH PROPOFOL N/A 11/17/2017    Procedure: COLONOSCOPY WITH PROPOFOL;  Surgeon: West Bali, MD;  Location: [facility];  Service: Endoscopy;  Laterality: N/A;  8:45am   ESOPHAGOGASTRODUODENOSCOPY (EGD) WITH PROPOFOL N/A 11/17/2017    Procedure: ESOPHAGOGASTRODUODENOSCOPY (EGD) WITH PROPOFOL;  Surgeon: West Bali, MD;  Location: [facility];  Service: Endoscopy;  Laterality: N/A;   GIVENS CAPSULE STUDY N/A 11/30/2017    Procedure: GIVENS CAPSULE STUDY;  Surgeon: West Bali, MD;  Location: [facility];  Service: Endoscopy;  Laterality: N/A;  7:30am   LITHOTRIPSY   2017   POLYPECTOMY   11/17/2017    Procedure: POLYPECTOMY;  Surgeon: West Bali, MD;  Location: [facility];  Service: Endoscopy;;  colon     REPLACEMENT TOTAL KNEE BILATERAL       TONSILLECTOMY       TOTAL HIP ARTHROPLASTY Right            Home Medications:  Allergies as of  10/22/2022         Reactions    Nsaids Itching, Rash    If given in large doses.  If given in large doses.     Sulfa Antibiotics Rash    Other Hives, Itching, Swelling     : Tape, Other Reaction: hives. "cloth tape is better"    Erythromycin Hives, Rash    Tamsulosin Itching, Rash    Tape Itching, Rash    Tapentadol Itching, Rash            Medication List           Accurate as of October 22, 2022 10:26 AM. If you have any questions, ask your nurse or doctor.              acetaminophen 500 MG tablet Commonly known as: TYLENOL Take 1,000 mg by mouth 3 (three) times daily as needed for moderate pain or headache.    amLODipine 5 MG tablet Commonly known as: NORVASC Take 5 mg by mouth daily with supper.    atorvastatin 20 MG tablet Commonly known as: LIPITOR Take 10 mg by mouth daily with  supper.    Benicar HCT 20-12.5 MG tablet Generic drug: olmesartan-hydrochlorothiazide Take 1 tablet by mouth daily.    cefUROXime 500 MG tablet Commonly known as: CEFTIN Take 1 tablet (500 mg total) by mouth 2 (two) times daily with a meal.    cetirizine 10 MG tablet Commonly known as: ZYRTEC Take 10 mg by mouth at bedtime as needed for allergies.    ciprofloxacin 500 MG tablet Commonly known as: CIPRO Take 1 tablet (500 mg total) by mouth every 12 (twelve) hours.    desvenlafaxine 50 MG 24 hr tablet Commonly known as: PRISTIQ Take 50 mg by mouth daily.    dexamethasone 0.5 MG/5ML solution Commonly known as: DECADRON Take 5 mLs by mouth 2 (two) times daily as needed (oral sores).    doxycycline 100 MG capsule Commonly known as: VIBRAMYCIN Take 1 capsule (100 mg total) by mouth every 12 (twelve) hours.    doxycycline 100 MG capsule Commonly known as: VIBRAMYCIN Take 1 capsule (100 mg total) by mouth 2 (two) times daily.    estradiol 0.1 MG/GM vaginal cream Commonly known as: ESTRACE SMARTSIG:Gram(s) Vaginal Twice a Week    famotidine 20 MG tablet Commonly known as:  PEPCID Take by mouth.    fenofibrate 160 MG tablet Take 160 mg by mouth daily with lunch.    fenofibrate micronized 200 MG capsule Commonly known as: LOFIBRA Take 200 mg by mouth daily.    Fish Oil 1200 MG Caps Take 1,200 mg by mouth 3 (three) times daily.    fluocinonide gel 0.05 % Commonly known as: LIDEX 1 application See admin instructions. Apply orally (dont swallow) 3 times daily as needed for mouth sores    Gemtesa 75 MG Tabs Generic drug: Vibegron Take 1 tablet by mouth daily.    ibuprofen 200 MG tablet Commonly known as: ADVIL Take 400 mg by mouth 3 (three) times daily as needed for headache or moderate pain.    indapamide 2.5 MG tablet Commonly known as: LOZOL Take 1 tablet (2.5 mg total) by mouth daily.    metFORMIN 1000 MG tablet Commonly known as: GLUCOPHAGE Take 500-1,000 mg by mouth See admin instructions. Take 1000 mg with breakfast, 500 mg with lunch, and 1000 mg with dinner    metFORMIN 500 MG 24 hr tablet Commonly known as: GLUCOPHAGE-XR Take 500 mg by mouth 3 (three) times daily.    Mounjaro 15 MG/0.5ML Pen Generic drug: tirzepatide Inject into the skin.    multivitamin tablet Take 1 tablet by mouth daily with lunch.    nitrofurantoin (macrocrystal-monohydrate) 100 MG capsule Commonly known as: MACROBID Take 1 capsule (100 mg total) by mouth every 12 (twelve) hours.    nitrofurantoin (macrocrystal-monohydrate) 100 MG capsule Commonly known as: MACROBID Take 1 capsule (100 mg total) by mouth 2 (two) times daily.    nitrofurantoin (macrocrystal-monohydrate) 100 MG capsule Commonly known as: MACROBID Take 1 capsule (100 mg total) by mouth every 12 (twelve) hours.    nitrofurantoin 50 MG capsule Commonly known as: Macrodantin Take 1 capsule (50 mg total) by mouth at bedtime. Take nightly after after completing 7 day of macrobid    omeprazole 20 MG capsule Commonly known as: PRILOSEC 1 PO 30 MINS PRIOR TO BREAKFAST.    PRESERVISION AREDS  PO Take 1 capsule by mouth 2 (two) times daily.    ranitidine 150 MG tablet Commonly known as: ZANTAC Take 150 mg by mouth at bedtime as needed for heartburn.    SYSTANE ULTRA OP Place 1 drop  into both eyes 4 (four) times daily as needed (dry eyes).    tamsulosin 0.4 MG Caps capsule Commonly known as: FLOMAX Take 0.4 mg by mouth daily as needed (kidney stone).    trimethoprim 100 MG tablet Commonly known as: TRIMPEX Take 1 tablet (100 mg total) by mouth at bedtime.    Vitamin D3 125 MCG (5000 UT) Tabs Take 5,000 Units by mouth daily with supper.             Allergies:  Allergies       Allergies  Allergen Reactions   Nsaids Itching and Rash      If given in large doses.  If given in large doses.    Sulfa Antibiotics Rash   Other Hives, Itching and Swelling       : Tape, Other Reaction: hives. "cloth tape is better"   Erythromycin Hives and Rash   Tamsulosin Itching and Rash   Tape Itching and Rash   Tapentadol Itching and Rash        Family History:      Family History  Problem Relation Age of Onset   Liver cancer Mother          ?started in pancreas   Parkinson's disease Father     Pneumonia Father     Colon cancer Father 110        metastatic but did well   Thyroid cancer Father     Other Daughter 10        brain tumor, followed with serial MRIs. now age 65   Colon cancer Paternal Aunt 46          Social History:  reports that she quit smoking about 54 years ago. Her smoking use included cigarettes. She started smoking about 55 years ago. She has a 0.3 pack-year smoking history. She has never used smokeless tobacco. She reports current alcohol use. She reports that she does not use drugs.   ROS: All other review of systems were reviewed and are negative except what is noted above in HPI   Physical Exam: BP (!) 115/59   Pulse 100   Constitutional:  Alert and oriented, No acute distress. HEENT: [place] AT, moist mucus membranes.  Trachea midline, no  masses. Cardiovascular: No clubbing, cyanosis, or edema. Respiratory: Normal respiratory effort, no increased work of breathing. GI: Abdomen is soft, nontender, nondistended, no abdominal masses GU: No CVA tenderness.  Lymph: No cervical or inguinal lymphadenopathy. Skin: No rashes, bruises or suspicious lesions. Neurologic: Grossly intact, no focal deficits, moving all 4 extremities. Psychiatric: Normal mood and affect.   Laboratory Data: Recent Labs       Lab Results  Component Value Date    WBC 13.6 (H) 01/27/2018    HGB 8.9 (L) 01/27/2018    HCT 29.6 (L) 01/27/2018    MCV 80.9 01/27/2018    PLT 336 01/27/2018        Recent Labs       Lab Results  Component Value Date    CREATININE 0.96 01/25/2018        Recent Labs  No results found for: "PSA"     Recent Labs  No results found for: "TESTOSTERONE"     Recent Labs  No results found for: "HGBA1C"     Urinalysis Labs (Brief)          Component Value Date/Time    COLORURINE AMBER (A) 01/24/2018 1820    APPEARANCEUR Turbid (A) 08/11/2022 1143    LABSPEC  1.015 01/24/2018 1820    PHURINE 5.0 01/24/2018 1820    GLUCOSEU Negative 08/11/2022 1143    HGBUR MODERATE (A) 01/24/2018 1820    BILIRUBINUR Negative 08/11/2022 1143    KETONESUR 5 (A) 01/24/2018 1820    PROTEINUR 2+ (A) 08/11/2022 1143    PROTEINUR 100 (A) 01/24/2018 1820    NITRITE Negative 08/11/2022 1143    NITRITE NEGATIVE 01/24/2018 1820    LEUKOCYTESUR 3+ (A) 08/11/2022 1143        Recent Labs       Lab Results  Component Value Date    LABMICR See below: 08/11/2022    WBCUA >30 (A) 08/11/2022    LABEPIT 0-10 08/11/2022    BACTERIA Few 08/11/2022        Pertinent Imaging: KUB today: Images reviewed and discussed with the patient  Results for orders placed during the hospital encounter of 07/23/22   Abdomen 1 view (KUB)   Narrative CLINICAL DATA:  Nephrolithiasis   EXAM: ABDOMEN - 1 VIEW   COMPARISON:  04/14/2022    FINDINGS: 13 x 9 mm calculus projects over upper pole of RIGHT kidney.   Tiny calculus inferior pole RIGHT kidney.   Two calculi are project over LEFT kidney, 16 x 15 mm at upper pole and 8 x 6 mm at mid to inferior kidney.   No ureteral calculi.   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   Increased stool in distal colon, bowel gas pattern otherwise normal.   IMPRESSION: BILATERAL renal calculi as above.     Electronically Signed By: Ulyses Southward M.D. On: 07/24/2022 15:21   No results found for this or any previous visit.   No results found for this or any previous visit.   No results found for this or any previous visit.   No results found for this or any previous visit.   No valid procedures specified. No results found for this or any previous visit.   Results for orders placed during the hospital encounter of 01/24/18   CT Renal Stone Study   Narrative CLINICAL DATA:  History of UTI and flank pain, initial encounter   EXAM: CT ABDOMEN AND PELVIS WITHOUT CONTRAST   TECHNIQUE: Multidetector CT imaging of the abdomen and pelvis was performed following the standard protocol without IV contrast.   COMPARISON:  None.   FINDINGS: Lower chest: Lung bases demonstrate no focal infiltrate or sizable effusion. A 5 mm subpleural nodular density is noted in the left lower lobe best seen on image number 10 of series 4.   Hepatobiliary: No focal liver abnormality is seen. Status post cholecystectomy. No biliary dilatation.   Pancreas: Unremarkable. No pancreatic ductal dilatation or surrounding inflammatory changes.   Spleen: Normal in size without focal abnormality.   Adrenals/Urinary Tract: Adrenal glands are within normal limits bilaterally. The left kidney demonstrates a central renal pelvic stone measuring 12 mm. No obstructive changes are seen. Bladder is partially distended. Right kidney demonstrates significant perinephric  stranding and appears somewhat enlarged when compared with the left. Tiny nonobstructing stone is noted in the right mid to lower pole. No significant hydronephrosis is noted although some thickening of the renal pelvis is seen. This may be related to the underlying UTI. No ureteral stone or obstructive changes are noted.   Stomach/Bowel: No obstructive or inflammatory changes of the bowel are seen. The appendix is within normal limits. Laxity of the anterior abdominal wall is seen with large panniculus anteriorly.   Vascular/Lymphatic: No significant  vascular findings are present. No enlarged abdominal or pelvic lymph nodes.   Reproductive: Uterus and bilateral adnexa are unremarkable.   Other: No abdominal wall hernia or abnormality. No abdominopelvic ascites.   Musculoskeletal: No acute or significant osseous findings. Right hip replacement is noted.   IMPRESSION: 12 mm nonobstructing left renal pelvic stone.   Tiny nonobstructing right renal stone.   Perinephric stranding as well as some enlargement of the right kidney and thickening of Gerota's fascia. This may be related to changes from recently passed stone. Possibility of pyelonephritis deserves consideration.   Left lower lobe 5 mm subpleural nodule. No follow-up needed if patient is low-risk. Non-contrast chest CT can be considered in 12 months if patient is high-risk. This recommendation follows the consensus statement: Guidelines for Management of Incidental Pulmonary Nodules Detected on CT Images: From the Fleischner Society 2017; Radiology 2017; 284:228-243.     Electronically Signed By: Alcide Clever M.D. On: 01/24/2018 19:45     Assessment & Plan:     1. Renal stone -We discussed the management of kidney stones. These options include observation, ureteroscopy, shockwave lithotripsy (ESWL) and percutaneous nephrolithotomy (PCNL). We discussed which options are relevant to the patient's stone(s). We  discussed the natural history of kidney stones as well as the complications of untreated stones and the impact on quality of life without treatment as well as with each of the above listed treatments. We also discussed the efficacy of each treatment in its ability to clear the stone burden. With any of these management options I discussed the signs and symptoms of infection and the need for emergent treatment should these be experienced. For each option we discussed the ability of each procedure to clear the patient of their stone burden.   For observation I described the risks which include but are not limited to silent renal damage, life-threatening infection, need for emergent surgery, failure to pass stone and pain.   For ureteroscopy I described the risks which include bleeding, infection, damage to contiguous structures, positioning injury, ureteral stricture, ureteral avulsion, ureteral injury, need for prolonged ureteral stent, inability to perform ureteroscopy, need for an interval procedure, inability to clear stone burden, stent discomfort/pain, heart attack, stroke, pulmonary embolus and the inherent risks with general anesthesia.   For shockwave lithotripsy I described the risks which include arrhythmia, kidney contusion, kidney hemorrhage, need for transfusion, pain, inability to adequately break up stone, inability to pass stone fragments, Steinstrasse, infection associated with obstructing stones, need for alternate surgical procedure, need for repeat shockwave lithotripsy, MI, CVA, PE and the inherent risks with anesthesia/conscious sedation.   For PCNL I described the risks including positioning injury, pneumothorax, hydrothorax, need for chest tube, inability to clear stone burden, renal laceration, arterial venous fistula or malformation, need for embolization of kidney, loss of kidney or renal function, need for repeat procedure, need for prolonged nephrostomy tube, ureteral avulsion,  MI, CVA, PE and the inherent risks of general anesthesia.   - The patient would like to proceed with left ureteroscopic stone extraction

## 2022-12-04 NOTE — Anesthesia Preprocedure Evaluation (Signed)
Anesthesia Evaluation  Patient identified by MRN, date of birth, ID band Patient awake    Reviewed: Allergy & Precautions, H&P , NPO status , Patient's Chart, lab work & pertinent test results, reviewed documented beta blocker date and time   Airway Mallampati: II  TM Distance: >3 FB Neck ROM: full    Dental no notable dental hx.    Pulmonary neg pulmonary ROS, sleep apnea , former smoker   Pulmonary exam normal breath sounds clear to auscultation       Cardiovascular Exercise Tolerance: Good hypertension, negative cardio ROS  Rhythm:regular Rate:Normal     Neuro/Psych  PSYCHIATRIC DISORDERS Anxiety Depression    negative neurological ROS  negative psych ROS   GI/Hepatic negative GI ROS, Neg liver ROS,,,  Endo/Other  negative endocrine ROSdiabetes    Renal/GU Renal diseasenegative Renal ROS  negative genitourinary   Musculoskeletal   Abdominal   Peds  Hematology negative hematology ROS (+) Blood dyscrasia, anemia   Anesthesia Other Findings   Reproductive/Obstetrics negative OB ROS                             Anesthesia Physical Anesthesia Plan  ASA: 2  Anesthesia Plan: General and General LMA   Post-op Pain Management:    Induction:   PONV Risk Score and Plan: Ondansetron  Airway Management Planned:   Additional Equipment:   Intra-op Plan:   Post-operative Plan:   Informed Consent: I have reviewed the patients History and Physical, chart, labs and discussed the procedure including the risks, benefits and alternatives for the proposed anesthesia with the patient or authorized representative who has indicated his/her understanding and acceptance.     Dental Advisory Given  Plan Discussed with: CRNA  Anesthesia Plan Comments:        Anesthesia Quick Evaluation

## 2022-12-04 NOTE — Transfer of Care (Addendum)
Immediate Anesthesia Transfer of Care Note  Patient: Felicia Hogan  Procedure(s) Performed: CYSTOSCOPY WITH RETROGRADE PYELOGRAM, URETEROSCOPY AND STENT PLACEMENT (Left) HOLMIUM LASER APPLICATION (Left)  Patient Location: PACU  Anesthesia Type:General  Level of Consciousness: awake and patient cooperative  Airway & Oxygen Therapy: Patient Spontanous Breathing and Patient connected to face mask oxygen  Post-op Assessment: Report given to RN and Post -op Vital signs reviewed and stable  Post vital signs: Reviewed and stable  Last Vitals:  Vitals Value Taken Time  BP 119/58 12/04/22 0938  Temp 36.7 12/04/22   0938  Pulse 80 12/04/22 0939  Resp 14 12/04/22 0939  SpO2 100 % 12/04/22 0939  Vitals shown include unfiled device data.  Last Pain:  Vitals:   12/04/22 0714  TempSrc: Oral      Patients Stated Pain Goal: 9 (12/04/22 0714)  Complications: No notable events documented.

## 2022-12-04 NOTE — Op Note (Signed)
.  Preoperative diagnosis: Left ureteral stone  Postoperative diagnosis: Same  Procedure: 1 cystoscopy 2. Left retrograde pyelography 3.  Intraoperative fluoroscopy, under one hour, with interpretation 4.  Left ureteroscopic stone manipulation with laser lithotripsy 5.  Left 6 x 26 JJ stent placement  Attending: Cleda Mccreedy  Anesthesia: General  Estimated blood loss: None  Drains: Left 6 x 26 JJ ureteral stent without tether  Specimens: stone for analysis  Antibiotics: rocephin  Findings: left renal pelvis stone. No hydronephrosis. No masses/lesions in the bladder. Ureteral orifices in normal anatomic location.  Indications: Patient is a 77 year old female with a history of left renal stone and who has persistent left flank pain and UTIs.  After discussing treatment options, she decided proceed with left ureteroscopic stone manipulation.  Procedure in detail: The patient was brought to the operating room and a brief timeout was done to ensure correct patient, correct procedure, correct site.  General anesthesia was administered patient was placed in dorsal lithotomy position.  Her genitalia was then prepped and draped in usual sterile fashion.  A rigid 22 French cystoscope was passed in the urethra and the bladder.  Bladder was inspected free masses or lesions.  the ureteral orifices were in the normal orthotopic locations.  a 6 french ureteral catheter was then instilled into the left ureteral orifice.  a gentle retrograde was obtained and findings noted above.  we then placed a zip wire through the ureteral catheter and advanced up to the renal pelvis.  we then removed the cystoscope and cannulated the left ureteral orifice with a semirigid ureteroscope.  No stone was found in the ureter. Once we reached the UPJ a sensor wire was advanced in to the renal pelvis. We then removed the ureteroscope and advanced am 12/14 x 35cm access sheath up to the renal pelvis. We then used the  flexible ureteroscope to perform nephroscopy. We encountered the stone in the renal pelvis. Using a 242nm laser fiber the stone was fragmented.  There was significant oozing from the renal pelvis and we elected to place a stent. We then removed the access sheath under direct vision and noted no injury to the ureter. We then placed a 6 x 26 double-j ureteral stent over the original zip wire.  We then removed the wire and good coil was noted in the the renal pelvis under fluoroscopy and the bladder under direct vision. the bladder was then drained and this concluded the procedure which was well tolerated by patient.  Complications: None  Condition: Stable, extubated, transferred to PACU  Plan: Patient is to be discharged home as to follow-up in 2 weeks for repeat CT stone study

## 2022-12-04 NOTE — Anesthesia Procedure Notes (Addendum)
Procedure Name: LMA Insertion Date/Time: 12/04/2022 8:47 AM  Performed by: Oletha Cruel, CRNAPre-anesthesia Checklist: Patient identified, Emergency Drugs available, Suction available and Timeout performed Patient Re-evaluated:Patient Re-evaluated prior to induction Oxygen Delivery Method: Circle system utilized Preoxygenation: Pre-oxygenation with 100% oxygen Induction Type: IV induction Ventilation: Mask ventilation without difficulty LMA: LMA inserted LMA Size: 4.0 Number of attempts: 1 Placement Confirmation: positive ETCO2, CO2 detector and breath sounds checked- equal and bilateral Tube secured with: Tape Dental Injury: Teeth and Oropharynx as per pre-operative assessment  Comments: Atraumatic insertion of LMA. Teeth/lips remain in preoperative condition. Long piece of paper tape applied across face under nose. Pink tape applied on top of paper tape to secure LMA in place.

## 2022-12-07 NOTE — Anesthesia Postprocedure Evaluation (Signed)
Anesthesia Post Note  Patient: [name]  Procedure(s) Performed: CYSTOSCOPY WITH RETROGRADE PYELOGRAM, URETEROSCOPY AND STENT PLACEMENT (Left) HOLMIUM LASER APPLICATION (Left)  Patient location during evaluation: Phase II Anesthesia Type: General Level of consciousness: awake Pain management: pain level controlled Vital Signs Assessment: post-procedure vital signs reviewed and stable Respiratory status: spontaneous breathing and respiratory function stable Cardiovascular status: blood pressure returned to baseline and stable Postop Assessment: no headache and no apparent nausea or vomiting Anesthetic complications: no Comments: Late entry   No notable events documented.   Last Vitals:  Vitals:   12/04/22 0955 12/04/22 1002  BP: 112/63 123/62  Pulse: 80 79  Resp: 15 18  Temp:  36.6 C  SpO2: 98% 99%    Last Pain:  Vitals:   12/05/22 1517  TempSrc:   PainSc: 0-No pain                 Windell Norfolk

## 2022-12-08 ENCOUNTER — Encounter (HOSPITAL_COMMUNITY): Payer: Self-pay | Admitting: Urology

## 2022-12-10 ENCOUNTER — Telehealth: Payer: Self-pay

## 2022-12-10 ENCOUNTER — Ambulatory Visit: Payer: Medicare Other | Admitting: Urology

## 2022-12-10 DIAGNOSIS — R1084 Generalized abdominal pain: Secondary | ICD-10-CM

## 2022-12-10 DIAGNOSIS — N200 Calculus of kidney: Secondary | ICD-10-CM

## 2022-12-10 NOTE — Telephone Encounter (Signed)
Patient calling to verify if she will need a MRI Done prior to her 12-17-2022 appointment with Dr. Ronne Binning?  Please advise.  Call:  [phone]

## 2022-12-10 NOTE — Telephone Encounter (Signed)
Return call to patient. Patient is made aware she would need a CT stone study prior scheduled appointment. Patient is aware authorization is needed and someone should reach out to get Ct stone study scheduled. Patient voiced understanding.

## 2022-12-11 ENCOUNTER — Other Ambulatory Visit: Payer: Self-pay | Admitting: Urology

## 2022-12-17 ENCOUNTER — Ambulatory Visit (HOSPITAL_COMMUNITY)
Admission: RE | Admit: 2022-12-17 | Discharge: 2022-12-17 | Disposition: A | Discharge status: Home / Self Care | Payer: Medicare Other | Source: Ambulatory Visit | Attending: Urology | Admitting: Urology

## 2022-12-17 ENCOUNTER — Other Ambulatory Visit: Payer: Self-pay | Admitting: Urology

## 2022-12-17 ENCOUNTER — Ambulatory Visit (INDEPENDENT_AMBULATORY_CARE_PROVIDER_SITE_OTHER): Payer: Medicare Other | Admitting: Urology

## 2022-12-17 VITALS — BP 122/58 | HR 89

## 2022-12-17 DIAGNOSIS — N200 Calculus of kidney: Secondary | ICD-10-CM | POA: Insufficient documentation

## 2022-12-17 DIAGNOSIS — R300 Dysuria: Secondary | ICD-10-CM

## 2022-12-17 DIAGNOSIS — N3001 Acute cystitis with hematuria: Secondary | ICD-10-CM

## 2022-12-17 LAB — URINALYSIS, ROUTINE W REFLEX MICROSCOPIC
Bilirubin, UA: NEGATIVE
Glucose, UA: NEGATIVE
Ketones, UA: NEGATIVE
Nitrite, UA: POSITIVE — AB
Specific Gravity, UA: 1.01 (ref 1.005–1.030)
Urobilinogen, Ur: 0.2 mg/dL (ref 0.2–1.0)
pH, UA: 6.5 (ref 5.0–7.5)

## 2022-12-17 LAB — MICROSCOPIC EXAMINATION
RBC, Urine: >30 /hpf — AB (ref 0–2)
WBC, UA: >30 /hpf — AB (ref 0–5)

## 2022-12-17 MED ORDER — POTASSIUM CITRATE ER 15 MEQ (1620 MG) PO TBCR: 1.0000 | EXTENDED_RELEASE_TABLET | Freq: Two times a day (BID) | ORAL | 11 refills | Status: DC

## 2022-12-17 MED ORDER — CIPROFLOXACIN HCL 500 MG PO TABS
500.0000 mg | ORAL_TABLET | Freq: Two times a day (BID) | ORAL | 0 refills | Status: DC
Start: 2022-12-17 — End: 2023-01-05

## 2022-12-17 NOTE — Progress Notes (Signed)
12/17/2022 10:39 AM   Felicia Hogan 1945/08/29 [id number]  Referring provider: Alinda Deem, MD [address] [place],  [place] [address]  No chief complaint on file.   HPI: Felicia Hogan is a 77yo here for followup for nephrolithiasis.. She had mild discomfort with the stent. Mild dysuira. 24 hour urine showed calcium decreased to 78 from 414 on indapoamide. Citrate decreased to 133 from 867   PMH: Past Medical History:  Diagnosis Date   Acute pyelonephritis 01/24/2018   Anxiety    Arthritis    Depression    Diabetes mellitus without complication (HCC)    Dyslipidemia    History of kidney stones    Hypertension    Iron deficiency anemia due to chronic blood loss    Mucous membrane pemphigoid 2014   so far only infects mouth. treated with oral steroids for flares. Lidex as well.    Sleep apnea     Surgical History: Past Surgical History:  Procedure Laterality Date   BIOPSY  11/17/2017   Procedure: BIOPSY;  Surgeon: West Bali, MD;  [facility];  Service: Endoscopy;;  gastric duodenum   CESAREAN SECTION     twice   CHOLECYSTECTOMY     COLONOSCOPY  10/2012   Dr. Aleene Davidson: normal. due 10/2017.    COLONOSCOPY WITH PROPOFOL N/A 11/17/2017   Procedure: COLONOSCOPY WITH PROPOFOL;  Surgeon: West Bali, MD;  [facility];  Service: Endoscopy;  Laterality: N/A;  8:45am   CYSTOSCOPY WITH RETROGRADE PYELOGRAM, URETEROSCOPY AND STENT PLACEMENT Left 12/04/2022   Procedure: CYSTOSCOPY WITH RETROGRADE PYELOGRAM, URETEROSCOPY AND STENT PLACEMENT;  Surgeon: Malen Gauze, MD;  [facility]: [facility];  Service: Urology;  Laterality: Left;   ESOPHAGOGASTRODUODENOSCOPY (EGD) WITH PROPOFOL N/A 11/17/2017   Procedure: ESOPHAGOGASTRODUODENOSCOPY (EGD) WITH PROPOFOL;  Surgeon: West Bali, MD;  [facility];  Service: Endoscopy;  Laterality: N/A;   GIVENS CAPSULE STUDY N/A 11/30/2017   Procedure: GIVENS CAPSULE STUDY;  Surgeon: West Bali, MD;   [facility];  Service: Endoscopy;  Laterality: N/A;  7:30am   HOLMIUM LASER APPLICATION Left 12/04/2022   Procedure: HOLMIUM LASER APPLICATION;  Surgeon: Malen Gauze, MD;  [facility]: [facility];  Service: Urology;  Laterality: Left;   LITHOTRIPSY  2017   POLYPECTOMY  11/17/2017   Procedure: POLYPECTOMY;  Surgeon: West Bali, MD;  [facility];  Service: Endoscopy;;  colon    REPLACEMENT TOTAL KNEE BILATERAL     TONSILLECTOMY     TOTAL HIP ARTHROPLASTY Right     Home Medications:  Allergies as of 12/17/2022       Reactions   Nsaids Itching, Rash   If given in large doses.    Sulfa Antibiotics Rash   Erythromycin Hives, Rash   Tamsulosin Itching, Rash   Tape Itching, Rash        Medication List        Accurate as of December 17, 2022 10:39 AM. If you have any questions, ask your nurse or doctor.          acetaminophen 500 MG tablet Commonly known as: TYLENOL Take 1,000 mg by mouth at bedtime.   BIOTIN PO Take 1 tablet by mouth daily.   cefpodoxime 200 MG tablet Commonly known as: VANTIN Take 1 tablet (200 mg total) by mouth 2 (two) times daily.   COLLAGEN PO Take 1 Scoop by mouth daily.   desvenlafaxine 50 MG 24 hr tablet Commonly known as: PRISTIQ  Take 50 mg by mouth daily.   estradiol 0.1 MG/GM vaginal cream Commonly known as: ESTRACE Place 1 Applicatorful vaginally 2 (two) times a week.   Fish Oil 1200 MG Caps Take 1,200 mg by mouth 2 (two) times daily.   Gemtesa 75 MG Tabs Generic drug: Vibegron Take 1 tablet by mouth daily.   indapamide 2.5 MG tablet Commonly known as: LOZOL Take 1 tablet by mouth once daily   Mounjaro 12.5 MG/0.5ML Pen Generic drug: tirzepatide Inject 12.5 mg into the skin every Sunday.   oxyCODONE-acetaminophen 5-325 MG tablet Commonly known as: Percocet Take 1 tablet by mouth every 4 (four) hours as needed for severe pain.   PRESERVISION AREDS PO Take 1 capsule by mouth 2 (two) times  daily.   PROBIOTIC-PREBIOTIC PO Take 1 capsule by mouth daily.   Turmeric Curcumin Caps Take 1 capsule by mouth 2 (two) times daily.        Allergies:  Allergies  Allergen Reactions   Nsaids Itching and Rash    If given in large doses.     Sulfa Antibiotics Rash   Erythromycin Hives and Rash   Tamsulosin Itching and Rash   Tape Itching and Rash    Family History: Family History  Problem Relation Age of Onset   Liver cancer Mother        ?started in pancreas   Parkinson's disease Father    Pneumonia Father    Colon cancer Father 7       metastatic but did well   Thyroid cancer Father    Other Daughter 10       brain tumor, followed with serial MRIs. now age 72   Colon cancer Paternal Aunt 24    Social History:  reports that she quit smoking about 55 years ago. Her smoking use included cigarettes. She started smoking about 56 years ago. She has a 0.3 pack-year smoking history. She has never used smokeless tobacco. She reports current alcohol use. She reports that she does not use drugs.  ROS: All other review of systems were reviewed and are negative except what is noted above in HPI  Physical Exam: BP (!) 122/58   Pulse 89   Constitutional:  Alert and oriented, No acute distress. HEENT: [place] AT, moist mucus membranes.  Trachea midline, no masses. Cardiovascular: No clubbing, cyanosis, or edema. Respiratory: Normal respiratory effort, no increased work of breathing. GI: Abdomen is soft, nontender, nondistended, no abdominal masses GU: No CVA tenderness.  Lymph: No cervical or inguinal lymphadenopathy. Skin: No rashes, bruises or suspicious lesions. Neurologic: Grossly intact, no focal deficits, moving all 4 extremities. Psychiatric: Normal mood and affect.  Laboratory Data: Lab Results  Component Value Date   WBC 8.8 10/23/2022   HGB 14.6 10/23/2022   HCT 44.8 10/23/2022   MCV 86.8 10/23/2022   PLT 327 10/23/2022    Lab Results  Component Value Date    CREATININE 1.03 (H) 10/23/2022    No results found for: "PSA"  No results found for: "TESTOSTERONE"  No results found for: "HGBA1C"  Urinalysis    Component Value Date/Time   COLORURINE YELLOW 10/23/2022 1856   APPEARANCEUR Clear 11/26/2022 1332   LABSPEC 1.010 10/23/2022 1856   PHURINE 6.0 10/23/2022 1856   GLUCOSEU Negative 11/26/2022 1332   HGBUR MODERATE (A) 10/23/2022 1856   BILIRUBINUR Negative 11/26/2022 1332   KETONESUR NEGATIVE 10/23/2022 1856   PROTEINUR Trace 11/26/2022 1332   PROTEINUR 100 (A) 10/23/2022 1856   NITRITE Negative 11/26/2022 1332  NITRITE NEGATIVE 10/23/2022 1856   LEUKOCYTESUR 3+ (A) 11/26/2022 1332   LEUKOCYTESUR LARGE (A) 10/23/2022 1856    Lab Results  Component Value Date   LABMICR See below: 11/26/2022   WBCUA >30 (A) 11/26/2022   LABEPIT 0-10 11/26/2022   BACTERIA Few (A) 11/26/2022    Pertinent Imaging:  Results for orders placed during the hospital encounter of 10/22/22  Abdomen 1 view (KUB)  Narrative CLINICAL DATA:  Nephrolithiasis  EXAM: ABDOMEN - 1 VIEW  COMPARISON:  07/23/2022, 04/14/2022, CT 01/25/2018  FINDINGS: Right hip replacement with normal alignment. Nonobstructed gas pattern with moderate stool. Redemonstrated bilateral kidney stones. 11 mm stone projecting over upper pole of right kidney. Punctate stone projects over lower pole right kidney. Dominant 15 mm left kidney stone with suspected smaller 8 mm stone. Findings do not appear significantly changed.  IMPRESSION: Bilateral kidney stones, not significantly changed.   Electronically Signed By: Jasmine Pang M.D. On: 10/23/2022 19:17  No results found for this or any previous visit.  No results found for this or any previous visit.  No results found for this or any previous visit.  No results found for this or any previous visit.  No valid procedures specified. No results found for this or any previous visit.  Results for orders placed  during the hospital encounter of 01/24/18  CT Renal Stone Study  Narrative CLINICAL DATA:  History of UTI and flank pain, initial encounter  EXAM: CT ABDOMEN AND PELVIS WITHOUT CONTRAST  TECHNIQUE: Multidetector CT imaging of the abdomen and pelvis was performed following the standard protocol without IV contrast.  COMPARISON:  None.  FINDINGS: Lower chest: Lung bases demonstrate no focal infiltrate or sizable effusion. A 5 mm subpleural nodular density is noted in the left lower lobe best seen on image number 10 of series 4.  Hepatobiliary: No focal liver abnormality is seen. Status post cholecystectomy. No biliary dilatation.  Pancreas: Unremarkable. No pancreatic ductal dilatation or surrounding inflammatory changes.  Spleen: Normal in size without focal abnormality.  Adrenals/Urinary Tract: Adrenal glands are within normal limits bilaterally. The left kidney demonstrates a central renal pelvic stone measuring 12 mm. No obstructive changes are seen. Bladder is partially distended. Right kidney demonstrates significant perinephric stranding and appears somewhat enlarged when compared with the left. Tiny nonobstructing stone is noted in the right mid to lower pole. No significant hydronephrosis is noted although some thickening of the renal pelvis is seen. This may be related to the underlying UTI. No ureteral stone or obstructive changes are noted.  Stomach/Bowel: No obstructive or inflammatory changes of the bowel are seen. The appendix is within normal limits. Laxity of the anterior abdominal wall is seen with large panniculus anteriorly.  Vascular/Lymphatic: No significant vascular findings are present. No enlarged abdominal or pelvic lymph nodes.  Reproductive: Uterus and bilateral adnexa are unremarkable.  Other: No abdominal wall hernia or abnormality. No abdominopelvic ascites.  Musculoskeletal: No acute or significant osseous findings. Right  hip replacement is noted.  IMPRESSION: 12 mm nonobstructing left renal pelvic stone.  Tiny nonobstructing right renal stone.  Perinephric stranding as well as some enlargement of the right kidney and thickening of Gerota's fascia. This may be related to changes from recently passed stone. Possibility of pyelonephritis deserves consideration.  Left lower lobe 5 mm subpleural nodule. No follow-up needed if patient is low-risk. Non-contrast chest CT can be considered in 12 months if patient is high-risk. This recommendation follows the consensus statement: Guidelines for Management of Incidental Pulmonary  Nodules Detected on CT Images: From the Fleischner Society 2017; Radiology 2017; [phone].   Electronically Signed By: Alcide Clever M.D. On: 01/24/2018 19:45   Assessment & Plan:    1. Renal stone -We will start urocitK BID - CT stone study -followup 2 weeks with BMP and possible cystoscopy/stent removal - Urinalysis, Routine w reflex microscopic   No follow-ups on file.  Wilkie Aye, MD  [facility] [place]

## 2022-12-20 LAB — URINE CULTURE

## 2022-12-21 ENCOUNTER — Encounter: Payer: Self-pay | Admitting: Urology

## 2022-12-21 NOTE — Patient Instructions (Signed)

## 2022-12-22 ENCOUNTER — Other Ambulatory Visit: Payer: Medicare Other

## 2022-12-22 ENCOUNTER — Other Ambulatory Visit: Payer: Self-pay

## 2022-12-22 DIAGNOSIS — N200 Calculus of kidney: Secondary | ICD-10-CM

## 2022-12-24 LAB — BASIC METABOLIC PANEL
BUN/Creatinine Ratio: 36 — ABNORMAL HIGH (ref 12–28)
BUN: 28 mg/dL — ABNORMAL HIGH (ref 8–27)
CO2: 29 mmol/L (ref 20–29)
Calcium: 10.8 mg/dL — ABNORMAL HIGH (ref 8.7–10.3)
Chloride: 98 mmol/L (ref 96–106)
Creatinine, Ser: 0.77 mg/dL (ref 0.57–1.00)
Glucose: 93 mg/dL (ref 70–99)
Potassium: 4.4 mmol/L (ref 3.5–5.2)
Sodium: 140 mmol/L (ref 134–144)
eGFR: 80 mL/min/{1.73_m2} (ref >59)

## 2023-01-05 ENCOUNTER — Ambulatory Visit: Payer: Medicare Other | Admitting: Urology

## 2023-01-05 VITALS — BP 121/69 | HR 86

## 2023-01-05 DIAGNOSIS — N3001 Acute cystitis with hematuria: Secondary | ICD-10-CM | POA: Diagnosis not present

## 2023-01-05 DIAGNOSIS — N200 Calculus of kidney: Secondary | ICD-10-CM

## 2023-01-05 LAB — URINALYSIS, ROUTINE W REFLEX MICROSCOPIC
Bilirubin, UA: NEGATIVE
Glucose, UA: NEGATIVE
Ketones, UA: NEGATIVE
Nitrite, UA: POSITIVE — AB
Specific Gravity, UA: 1.01 (ref 1.005–1.030)
Urobilinogen, Ur: 0.2 mg/dL (ref 0.2–1.0)
pH, UA: 7 (ref 5.0–7.5)

## 2023-01-05 LAB — MICROSCOPIC EXAMINATION
RBC, Urine: >30 /[HPF] — AB (ref 0–2)
WBC, UA: >30 /[HPF] — AB (ref 0–5)

## 2023-01-05 MED ORDER — OXYCODONE-ACETAMINOPHEN 5-325 MG PO TABS
1.0000 | ORAL_TABLET | ORAL | 0 refills | Status: DC | PRN
Start: 2023-01-05 — End: 2023-01-22

## 2023-01-05 MED ORDER — CIPROFLOXACIN HCL 500 MG PO TABS
500.0000 mg | ORAL_TABLET | Freq: Two times a day (BID) | ORAL | 0 refills | Status: DC
Start: 2023-01-05 — End: 2023-01-13

## 2023-01-05 NOTE — Progress Notes (Signed)
01/05/2023 1:46 PM   Felicia Hogan [birth year] [id number]  Referring provider: Alinda Deem, MD [address] [place],  [place] [address]  Followup nephrolithiasis   HPI: Felicia Hogan is a 77yo here for follwoup for nephrolithiasis. CT shows residual left lower pole calculus. She denies any flank pain. No significant LUTS.    PMH: Past Medical History:  Diagnosis Date   Acute pyelonephritis 01/24/2018   Anxiety    Arthritis    Depression    Diabetes mellitus without complication (HCC)    Dyslipidemia    History of kidney stones    Hypertension    Iron deficiency anemia due to chronic blood loss    Mucous membrane pemphigoid 2014   so far only infects mouth. treated with oral steroids for flares. Lidex as well.    Sleep apnea     Surgical History: Past Surgical History:  Procedure Laterality Date   BIOPSY  11/17/2017   Procedure: BIOPSY;  Surgeon: West Bali, MD;  [facility];  Service: Endoscopy;;  gastric duodenum   CESAREAN SECTION     twice   CHOLECYSTECTOMY     COLONOSCOPY  10/2012   Dr. Aleene Davidson: normal. due 10/2017.    COLONOSCOPY WITH PROPOFOL N/A 11/17/2017   Procedure: COLONOSCOPY WITH PROPOFOL;  Surgeon: West Bali, MD;  [facility];  Service: Endoscopy;  Laterality: N/A;  8:45am   CYSTOSCOPY WITH RETROGRADE PYELOGRAM, URETEROSCOPY AND STENT PLACEMENT Left 12/04/2022   Procedure: CYSTOSCOPY WITH RETROGRADE PYELOGRAM, URETEROSCOPY AND STENT PLACEMENT;  Surgeon: Malen Gauze, MD;  Location: [facility];  Service: Urology;  Laterality: Left;   ESOPHAGOGASTRODUODENOSCOPY (EGD) WITH PROPOFOL N/A 11/17/2017   Procedure: ESOPHAGOGASTRODUODENOSCOPY (EGD) WITH PROPOFOL;  Surgeon: West Bali, MD;  [facility];  Service: Endoscopy;  Laterality: N/A;   GIVENS CAPSULE STUDY N/A 11/30/2017   Procedure: GIVENS CAPSULE STUDY;  Surgeon: West Bali, MD;  [facility];  Service: Endoscopy;  Laterality: N/A;  7:30am    HOLMIUM LASER APPLICATION Left 12/04/2022   Procedure: HOLMIUM LASER APPLICATION;  Surgeon: Malen Gauze, MD;  [facility];  Service: Urology;  Laterality: Left;   LITHOTRIPSY  2017   POLYPECTOMY  11/17/2017   Procedure: POLYPECTOMY;  Surgeon: West Bali, MD;  [facility];  Service: Endoscopy;;  colon    REPLACEMENT TOTAL KNEE BILATERAL     TONSILLECTOMY     TOTAL HIP ARTHROPLASTY Right     Home Medications:  Allergies as of 01/05/2023       Reactions   Nsaids Itching, Rash   If given in large doses.    Sulfa Antibiotics Rash   Erythromycin Hives, Rash   Tamsulosin Itching, Rash   Tape Itching, Rash        Medication List        Accurate as of January 05, 2023  1:46 PM. If you have any questions, ask your nurse or doctor.          acetaminophen 500 MG tablet Commonly known as: TYLENOL Take 1,000 mg by mouth at bedtime.   BIOTIN PO Take 1 tablet by mouth daily.   cefpodoxime 200 MG tablet Commonly known as: VANTIN Take 1 tablet (200 mg total) by mouth 2 (two) times daily.   ciprofloxacin 500 MG tablet Commonly known as: CIPRO Take 1 tablet (500 mg total) by mouth every 12 (twelve) hours.   COLLAGEN PO Take 1 Scoop by mouth daily.   desvenlafaxine  50 MG 24 hr tablet Commonly known as: PRISTIQ Take 50 mg by mouth daily.   estradiol 0.1 MG/GM vaginal cream Commonly known as: ESTRACE Place 1 Applicatorful vaginally 2 (two) times a week.   Fish Oil 1200 MG Caps Take 1,200 mg by mouth 2 (two) times daily.   Gemtesa 75 MG Tabs Generic drug: Vibegron Take 1 tablet by mouth daily.   indapamide 2.5 MG tablet Commonly known as: LOZOL Take 1 tablet by mouth once daily   Mounjaro 12.5 MG/0.5ML Pen Generic drug: tirzepatide Inject 12.5 mg into the skin every Sunday.   oxyCODONE-acetaminophen 5-325 MG tablet Commonly known as: Percocet Take 1 tablet by mouth every 4 (four) hours as needed. What changed: reasons to take this    Potassium Citrate 15 MEQ (1620 MG) Tbcr Commonly known as: Urocit-K 15 Take 1 tablet by mouth 2 (two) times daily.   PRESERVISION AREDS PO Take 1 capsule by mouth 2 (two) times daily.   PROBIOTIC-PREBIOTIC PO Take 1 capsule by mouth daily.   Turmeric Curcumin Caps Take 1 capsule by mouth 2 (two) times daily.        Allergies:  Allergies  Allergen Reactions   Nsaids Itching and Rash    If given in large doses.     Sulfa Antibiotics Rash   Erythromycin Hives and Rash   Tamsulosin Itching and Rash   Tape Itching and Rash    Family History: Family History  Problem Relation Age of Onset   Liver cancer Mother        ?started in pancreas   Parkinson's disease Father    Pneumonia Father    Colon cancer Father 24       metastatic but did well   Thyroid cancer Father    Other Daughter 10       brain tumor, followed with serial MRIs. now age 11   Colon cancer Paternal Aunt 48    Social History:  reports that she quit smoking about 55 years ago. Her smoking use included cigarettes. She started smoking about 56 years ago. She has a 0.3 pack-year smoking history. She has never used smokeless tobacco. She reports current alcohol use. She reports that she does not use drugs.  ROS: All other review of systems were reviewed and are negative except what is noted above in HPI  Physical Exam: BP 121/69   Pulse 86   Constitutional:  Alert and oriented, No acute distress. HEENT: [place] AT, moist mucus membranes.  Trachea midline, no masses. Cardiovascular: No clubbing, cyanosis, or edema. Respiratory: Normal respiratory effort, no increased work of breathing. GI: Abdomen is soft, nontender, nondistended, no abdominal masses GU: No CVA tenderness.  Lymph: No cervical or inguinal lymphadenopathy. Skin: No rashes, bruises or suspicious lesions. Neurologic: Grossly intact, no focal deficits, moving all 4 extremities. Psychiatric: Normal mood and affect.  Laboratory Data: Lab  Results  Component Value Date   WBC 8.8 10/23/2022   HGB 14.6 10/23/2022   HCT 44.8 10/23/2022   MCV 86.8 10/23/2022   PLT 327 10/23/2022    Lab Results  Component Value Date   CREATININE 0.77 12/23/2022    No results found for: "PSA"  No results found for: "TESTOSTERONE"  No results found for: "HGBA1C"  Urinalysis    Component Value Date/Time   COLORURINE YELLOW 10/23/2022 1856   APPEARANCEUR Clear 12/17/2022 1012   LABSPEC 1.010 10/23/2022 1856   PHURINE 6.0 10/23/2022 1856   GLUCOSEU Negative 12/17/2022 1012   HGBUR MODERATE (A)  10/23/2022 1856   BILIRUBINUR Negative 12/17/2022 1012   KETONESUR NEGATIVE 10/23/2022 1856   PROTEINUR 1+ (A) 12/17/2022 1012   PROTEINUR 100 (A) 10/23/2022 1856   NITRITE Positive (A) 12/17/2022 1012   NITRITE NEGATIVE 10/23/2022 1856   LEUKOCYTESUR 3+ (A) 12/17/2022 1012   LEUKOCYTESUR LARGE (A) 10/23/2022 1856    Lab Results  Component Value Date   LABMICR See below: 12/17/2022   WBCUA >30 (A) 12/17/2022   LABEPIT 0-10 12/17/2022   BACTERIA Moderate (A) 12/17/2022    Pertinent Imaging: CT 12/17/2022: Images reviewed and discussed with the patient  Results for orders placed during the hospital encounter of 10/22/22  Abdomen 1 view (KUB)  Narrative CLINICAL DATA:  Nephrolithiasis  EXAM: ABDOMEN - 1 VIEW  COMPARISON:  07/23/2022, 04/14/2022, CT 01/25/2018  FINDINGS: Right hip replacement with normal alignment. Nonobstructed gas pattern with moderate stool. Redemonstrated bilateral kidney stones. 11 mm stone projecting over upper pole of right kidney. Punctate stone projects over lower pole right kidney. Dominant 15 mm left kidney stone with suspected smaller 8 mm stone. Findings do not appear significantly changed.  IMPRESSION: Bilateral kidney stones, not significantly changed.   Electronically Signed By: Jasmine Pang M.D. On: 10/23/2022 19:17  No results found for this or any previous visit.  No results  found for this or any previous visit.  No results found for this or any previous visit.  No results found for this or any previous visit.  No valid procedures specified. No results found for this or any previous visit.  Results for orders placed in visit on 12/17/22  CT RENAL STONE STUDY  Narrative CLINICAL DATA:  Urinary tract calculi.  EXAM: CT ABDOMEN AND PELVIS WITHOUT CONTRAST  TECHNIQUE: Multidetector CT imaging of the abdomen and pelvis was performed following the standard protocol without IV contrast.  RADIATION DOSE REDUCTION: This exam was performed according to the departmental dose-optimization program which includes automated exposure control, adjustment of the mA and/or kV according to patient size and/or use of iterative reconstruction technique.  COMPARISON:  01/24/2018 and abdomen radiograph from 10/22/2022  FINDINGS: Lower chest: Stable 5 by 3 mm subpleural nodule in the left lower lobe on image 19 series 4, stability over the last 5 years implies benign etiology. No further imaging workup of this lesion is indicated.  Mitral valve calcification.  Hepatobiliary: Cholecystectomy.  Otherwise unremarkable.  Pancreas: Unremarkable  Spleen: Unremarkable  Adrenals/Urinary Tract: 1.5 cm Bosniak category 1 cyst of the right kidney lower pole anteriorly on image 43 series 2. No further imaging workup of this lesion is indicated.  Obscuration of the right distal ureter and right side of the urinary bladder due to streak artifact from the patient's right hip implant.  Three nonobstructive right renal calculi are present, largest measures 1.1 cm in the upper pole.  Three nonobstructive left kidney lower pole calculi are observed with some staghorn characteristics, the largest is 1.6 cm in the lower pole.  Left double-J ureteral stent is present, no definite ureteral stone along the margin of the stent identified. Minimal caliectasis on the left  without hydroureter. Stent positioning satisfactory.  Stomach/Bowel: Lax anterior abdominal wall with rectus diastasis, abdominal contents overhang the proximal femurs through this laxity along the pannus. Air fluid levels in the sigmoid colon which could reflect diarrheal process. No dilated bowel.  Vascular/Lymphatic: Atherosclerosis is present, including aortoiliac atherosclerotic disease.  Reproductive: Unremarkable  Other: No supplemental non-categorized findings.  Musculoskeletal: Right total hip prosthesis. Lower thoracic and lumbar spondylosis  and degenerative disc disease causing multilevel impingement. Grade 1 degenerative retrolisthesis at L2-3 and grade 1 degenerative anterolisthesis at L4-5.  IMPRESSION: 1. Bilateral nonobstructive nephrolithiasis. 2. Left double-J ureteral stent in place, no definite ureteral stone along the margin of the stent identified. Minimal caliectasis on the left without hydroureter. 3. Air fluid levels in the sigmoid colon which could reflect diarrheal process. 4. Aortic atherosclerosis. 5. 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 6. Lax anterior abdominal wall with rectus diastasis, abdominal contents overhang the proximal femurs through this laxity along the pannus.  Aortic Atherosclerosis (ICD10-I70.0).   Electronically Signed By: Gaylyn Rong M.D. On: 12/17/2022 13:43   Assessment & Plan:    1. Renal stone -We discussed the management of kidney stones. These options include observation, ureteroscopy, shockwave lithotripsy (ESWL) and percutaneous nephrolithotomy (PCNL). We discussed which options are relevant to the patient's stone(s). We discussed the natural history of kidney stones as well as the complications of untreated stones and the impact on quality of life without treatment as well as with each of the above listed treatments. We also discussed the efficacy  of each treatment in its ability to clear the stone burden. With any of these management options I discussed the signs and symptoms of infection and the need for emergent treatment should these be experienced. For each option we discussed the ability of each procedure to clear the patient of their stone burden.   For observation I described the risks which include but are not limited to silent renal damage, life-threatening infection, need for emergent surgery, failure to pass stone and pain.   For ureteroscopy I described the risks which include bleeding, infection, damage to contiguous structures, positioning injury, ureteral stricture, ureteral avulsion, ureteral injury, need for prolonged ureteral stent, inability to perform ureteroscopy, need for an interval procedure, inability to clear stone burden, stent discomfort/pain, heart attack, stroke, pulmonary embolus and the inherent risks with general anesthesia.   For shockwave lithotripsy I described the risks which include arrhythmia, kidney contusion, kidney hemorrhage, need for transfusion, pain, inability to adequately break up stone, inability to pass stone fragments, Steinstrasse, infection associated with obstructing stones, need for alternate surgical procedure, need for repeat shockwave lithotripsy, MI, CVA, PE and the inherent risks with anesthesia/conscious sedation.   For PCNL I described the risks including positioning injury, pneumothorax, hydrothorax, need for chest tube, inability to clear stone burden, renal laceration, arterial venous fistula or malformation, need for embolization of kidney, loss of kidney or renal function, need for repeat procedure, need for prolonged nephrostomy tube, ureteral avulsion, MI, CVA, PE and the inherent risks of general anesthesia.   - The patient would like to proceed with left ureteroscopic stone extraction - Urinalysis, Routine w reflex microscopic  2. Acute cystitis with hematuria - Urine  Culture   No follow-ups on file.  Wilkie Aye, MD  [facility] 

## 2023-01-05 NOTE — H&P (View-Only) (Signed)
01/05/2023 1:46 PM   Felicia Hogan [birth year] [id number]  Referring provider: Alinda Deem, MD [address] [place],  [place] [address]  Followup nephrolithiasis   HPI: Felicia Hogan is a 77yo here for follwoup for nephrolithiasis. CT shows residual left lower pole calculus. She denies any flank pain. No significant LUTS.    PMH: Past Medical History:  Diagnosis Date   Acute pyelonephritis 01/24/2018   Anxiety    Arthritis    Depression    Diabetes mellitus without complication (HCC)    Dyslipidemia    History of kidney stones    Hypertension    Iron deficiency anemia due to chronic blood loss    Mucous membrane pemphigoid 2014   so far only infects mouth. treated with oral steroids for flares. Lidex as well.    Sleep apnea     Surgical History: Past Surgical History:  Procedure Laterality Date   BIOPSY  11/17/2017   Procedure: BIOPSY;  Surgeon: West Bali, MD;  [facility]: [facility];  Service: Endoscopy;;  gastric duodenum   CESAREAN SECTION     twice   CHOLECYSTECTOMY     COLONOSCOPY  10/2012   Dr. Aleene Davidson: normal. due 10/2017.    COLONOSCOPY WITH PROPOFOL N/A 11/17/2017   Procedure: COLONOSCOPY WITH PROPOFOL;  Surgeon: West Bali, MD;  [facility];  Service: Endoscopy;  Laterality: N/A;  8:45am   CYSTOSCOPY WITH RETROGRADE PYELOGRAM, URETEROSCOPY AND STENT PLACEMENT Left 12/04/2022   Procedure: CYSTOSCOPY WITH RETROGRADE PYELOGRAM, URETEROSCOPY AND STENT PLACEMENT;  Surgeon: Malen Gauze, MD;  Location: [facility];  Service: Urology;  Laterality: Left;   ESOPHAGOGASTRODUODENOSCOPY (EGD) WITH PROPOFOL N/A 11/17/2017   Procedure: ESOPHAGOGASTRODUODENOSCOPY (EGD) WITH PROPOFOL;  Surgeon: West Bali, MD;  [facility];  Service: Endoscopy;  Laterality: N/A;   GIVENS CAPSULE STUDY N/A 11/30/2017   Procedure: GIVENS CAPSULE STUDY;  Surgeon: West Bali, MD;  [facility];  Service: Endoscopy;  Laterality: N/A;  7:30am    HOLMIUM LASER APPLICATION Left 12/04/2022   Procedure: HOLMIUM LASER APPLICATION;  Surgeon: Malen Gauze, MD;  Location: [facility];  Service: Urology;  Laterality: Left;   LITHOTRIPSY  2017   POLYPECTOMY  11/17/2017   Procedure: POLYPECTOMY;  Surgeon: West Bali, MD;  [facility];  Service: Endoscopy;;  colon    REPLACEMENT TOTAL KNEE BILATERAL     TONSILLECTOMY     TOTAL HIP ARTHROPLASTY Right     Home Medications:  Allergies as of 01/05/2023       Reactions   Nsaids Itching, Rash   If given in large doses.    Sulfa Antibiotics Rash   Erythromycin Hives, Rash   Tamsulosin Itching, Rash   Tape Itching, Rash        Medication List        Accurate as of January 05, 2023  1:46 PM. If you have any questions, ask your nurse or doctor.          acetaminophen 500 MG tablet Commonly known as: TYLENOL Take 1,000 mg by mouth at bedtime.   BIOTIN PO Take 1 tablet by mouth daily.   cefpodoxime 200 MG tablet Commonly known as: VANTIN Take 1 tablet (200 mg total) by mouth 2 (two) times daily.   ciprofloxacin 500 MG tablet Commonly known as: CIPRO Take 1 tablet (500 mg total) by mouth every 12 (twelve) hours.   COLLAGEN PO Take 1 Scoop by mouth daily.   desvenlafaxine  50 MG 24 hr tablet Commonly known as: PRISTIQ Take 50 mg by mouth daily.   estradiol 0.1 MG/GM vaginal cream Commonly known as: ESTRACE Place 1 Applicatorful vaginally 2 (two) times a week.   Fish Oil 1200 MG Caps Take 1,200 mg by mouth 2 (two) times daily.   Gemtesa 75 MG Tabs Generic drug: Vibegron Take 1 tablet by mouth daily.   indapamide 2.5 MG tablet Commonly known as: LOZOL Take 1 tablet by mouth once daily   Mounjaro 12.5 MG/0.5ML Pen Generic drug: tirzepatide Inject 12.5 mg into the skin every Sunday.   oxyCODONE-acetaminophen 5-325 MG tablet Commonly known as: Percocet Take 1 tablet by mouth every 4 (four) hours as needed. What changed: reasons to take this    Potassium Citrate 15 MEQ (1620 MG) Tbcr Commonly known as: Urocit-K 15 Take 1 tablet by mouth 2 (two) times daily.   PRESERVISION AREDS PO Take 1 capsule by mouth 2 (two) times daily.   PROBIOTIC-PREBIOTIC PO Take 1 capsule by mouth daily.   Turmeric Curcumin Caps Take 1 capsule by mouth 2 (two) times daily.        Allergies:  Allergies  Allergen Reactions   Nsaids Itching and Rash    If given in large doses.     Sulfa Antibiotics Rash   Erythromycin Hives and Rash   Tamsulosin Itching and Rash   Tape Itching and Rash    Family History: Family History  Problem Relation Age of Onset   Liver cancer Mother        ?started in pancreas   Parkinson's disease Father    Pneumonia Father    Colon cancer Father 24       metastatic but did well   Thyroid cancer Father    Other Daughter 10       brain tumor, followed with serial MRIs. now age 11   Colon cancer Paternal Aunt 48    Social History:  reports that she quit smoking about 55 years ago. Her smoking use included cigarettes. She started smoking about 56 years ago. She has a 0.3 pack-year smoking history. She has never used smokeless tobacco. She reports current alcohol use. She reports that she does not use drugs.  ROS: All other review of systems were reviewed and are negative except what is noted above in HPI  Physical Exam: BP 121/69   Pulse 86   Constitutional:  Alert and oriented, No acute distress. HEENT: [place] AT, moist mucus membranes.  Trachea midline, no masses. Cardiovascular: No clubbing, cyanosis, or edema. Respiratory: Normal respiratory effort, no increased work of breathing. GI: Abdomen is soft, nontender, nondistended, no abdominal masses GU: No CVA tenderness.  Lymph: No cervical or inguinal lymphadenopathy. Skin: No rashes, bruises or suspicious lesions. Neurologic: Grossly intact, no focal deficits, moving all 4 extremities. Psychiatric: Normal mood and affect.  Laboratory Data: Lab  Results  Component Value Date   WBC 8.8 10/23/2022   HGB 14.6 10/23/2022   HCT 44.8 10/23/2022   MCV 86.8 10/23/2022   PLT 327 10/23/2022    Lab Results  Component Value Date   CREATININE 0.77 12/23/2022    No results found for: "PSA"  No results found for: "TESTOSTERONE"  No results found for: "HGBA1C"  Urinalysis    Component Value Date/Time   COLORURINE YELLOW 10/23/2022 1856   APPEARANCEUR Clear 12/17/2022 1012   LABSPEC 1.010 10/23/2022 1856   PHURINE 6.0 10/23/2022 1856   GLUCOSEU Negative 12/17/2022 1012   HGBUR MODERATE (A)  10/23/2022 1856   BILIRUBINUR Negative 12/17/2022 1012   KETONESUR NEGATIVE 10/23/2022 1856   PROTEINUR 1+ (A) 12/17/2022 1012   PROTEINUR 100 (A) 10/23/2022 1856   NITRITE Positive (A) 12/17/2022 1012   NITRITE NEGATIVE 10/23/2022 1856   LEUKOCYTESUR 3+ (A) 12/17/2022 1012   LEUKOCYTESUR LARGE (A) 10/23/2022 1856    Lab Results  Component Value Date   LABMICR See below: 12/17/2022   WBCUA >30 (A) 12/17/2022   LABEPIT 0-10 12/17/2022   BACTERIA Moderate (A) 12/17/2022    Pertinent Imaging: CT 12/17/2022: Images reviewed and discussed with the patient  Results for orders placed during the hospital encounter of 10/22/22  Abdomen 1 view (KUB)  Narrative CLINICAL DATA:  Nephrolithiasis  EXAM: ABDOMEN - 1 VIEW  COMPARISON:  07/23/2022, 04/14/2022, CT 01/25/2018  FINDINGS: Right hip replacement with normal alignment. Nonobstructed gas pattern with moderate stool. Redemonstrated bilateral kidney stones. 11 mm stone projecting over upper pole of right kidney. Punctate stone projects over lower pole right kidney. Dominant 15 mm left kidney stone with suspected smaller 8 mm stone. Findings do not appear significantly changed.  IMPRESSION: Bilateral kidney stones, not significantly changed.   Electronically Signed By: Jasmine Pang M.D. On: 10/23/2022 19:17  No results found for this or any previous visit.  No results  found for this or any previous visit.  No results found for this or any previous visit.  No results found for this or any previous visit.  No valid procedures specified. No results found for this or any previous visit.  Results for orders placed in visit on 12/17/22  CT RENAL STONE STUDY  Narrative CLINICAL DATA:  Urinary tract calculi.  EXAM: CT ABDOMEN AND PELVIS WITHOUT CONTRAST  TECHNIQUE: Multidetector CT imaging of the abdomen and pelvis was performed following the standard protocol without IV contrast.  RADIATION DOSE REDUCTION: This exam was performed according to the departmental dose-optimization program which includes automated exposure control, adjustment of the mA and/or kV according to patient size and/or use of iterative reconstruction technique.  COMPARISON:  01/24/2018 and abdomen radiograph from 10/22/2022  FINDINGS: Lower chest: Stable 5 by 3 mm subpleural nodule in the left lower lobe on image 19 series 4, stability over the last 5 years implies benign etiology. No further imaging workup of this lesion is indicated.  Mitral valve calcification.  Hepatobiliary: Cholecystectomy.  Otherwise unremarkable.  Pancreas: Unremarkable  Spleen: Unremarkable  Adrenals/Urinary Tract: 1.5 cm Bosniak category 1 cyst of the right kidney lower pole anteriorly on image 43 series 2. No further imaging workup of this lesion is indicated.  Obscuration of the right distal ureter and right side of the urinary bladder due to streak artifact from the patient's right hip implant.  Three nonobstructive right renal calculi are present, largest measures 1.1 cm in the upper pole.  Three nonobstructive left kidney lower pole calculi are observed with some staghorn characteristics, the largest is 1.6 cm in the lower pole.  Left double-J ureteral stent is present, no definite ureteral stone along the margin of the stent identified. Minimal caliectasis on the left  without hydroureter. Stent positioning satisfactory.  Stomach/Bowel: Lax anterior abdominal wall with rectus diastasis, abdominal contents overhang the proximal femurs through this laxity along the pannus. Air fluid levels in the sigmoid colon which could reflect diarrheal process. No dilated bowel.  Vascular/Lymphatic: Atherosclerosis is present, including aortoiliac atherosclerotic disease.  Reproductive: Unremarkable  Other: No supplemental non-categorized findings.  Musculoskeletal: Right total hip prosthesis. Lower thoracic and lumbar spondylosis  and degenerative disc disease causing multilevel impingement. Grade 1 degenerative retrolisthesis at L2-3 and grade 1 degenerative anterolisthesis at L4-5.  IMPRESSION: 1. Bilateral nonobstructive nephrolithiasis. 2. Left double-J ureteral stent in place, no definite ureteral stone along the margin of the stent identified. Minimal caliectasis on the left without hydroureter. 3. Air fluid levels in the sigmoid colon which could reflect diarrheal process. 4. Aortic atherosclerosis. 5. 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 6. Lax anterior abdominal wall with rectus diastasis, abdominal contents overhang the proximal femurs through this laxity along the pannus.  Aortic Atherosclerosis (ICD10-I70.0).   Electronically Signed By: Gaylyn Rong M.D. On: 12/17/2022 13:43   Assessment & Plan:    1. Renal stone -We discussed the management of kidney stones. These options include observation, ureteroscopy, shockwave lithotripsy (ESWL) and percutaneous nephrolithotomy (PCNL). We discussed which options are relevant to the patient's stone(s). We discussed the natural history of kidney stones as well as the complications of untreated stones and the impact on quality of life without treatment as well as with each of the above listed treatments. We also discussed the efficacy  of each treatment in its ability to clear the stone burden. With any of these management options I discussed the signs and symptoms of infection and the need for emergent treatment should these be experienced. For each option we discussed the ability of each procedure to clear the patient of their stone burden.   For observation I described the risks which include but are not limited to silent renal damage, life-threatening infection, need for emergent surgery, failure to pass stone and pain.   For ureteroscopy I described the risks which include bleeding, infection, damage to contiguous structures, positioning injury, ureteral stricture, ureteral avulsion, ureteral injury, need for prolonged ureteral stent, inability to perform ureteroscopy, need for an interval procedure, inability to clear stone burden, stent discomfort/pain, heart attack, stroke, pulmonary embolus and the inherent risks with general anesthesia.   For shockwave lithotripsy I described the risks which include arrhythmia, kidney contusion, kidney hemorrhage, need for transfusion, pain, inability to adequately break up stone, inability to pass stone fragments, Steinstrasse, infection associated with obstructing stones, need for alternate surgical procedure, need for repeat shockwave lithotripsy, MI, CVA, PE and the inherent risks with anesthesia/conscious sedation.   For PCNL I described the risks including positioning injury, pneumothorax, hydrothorax, need for chest tube, inability to clear stone burden, renal laceration, arterial venous fistula or malformation, need for embolization of kidney, loss of kidney or renal function, need for repeat procedure, need for prolonged nephrostomy tube, ureteral avulsion, MI, CVA, PE and the inherent risks of general anesthesia.   - The patient would like to proceed with left ureteroscopic stone extraction - Urinalysis, Routine w reflex microscopic  2. Acute cystitis with hematuria - Urine  Culture   No follow-ups on file.  Wilkie Aye, MD  [facility] 

## 2023-01-08 LAB — URINE CULTURE

## 2023-01-12 ENCOUNTER — Other Ambulatory Visit: Payer: Self-pay | Admitting: Urology

## 2023-01-13 ENCOUNTER — Encounter: Payer: Self-pay | Admitting: Urology

## 2023-01-13 ENCOUNTER — Telehealth: Payer: Self-pay

## 2023-01-13 MED ORDER — CIPROFLOXACIN HCL 250 MG PO TABS: 250.0000 mg | ORAL_TABLET | Freq: Every day | ORAL | 0 refills | Status: DC

## 2023-01-13 NOTE — Patient Instructions (Signed)

## 2023-01-13 NOTE — Telephone Encounter (Signed)
Patient states the she is doing well after taking the Cipro.  Will she need a daily abx until surgery?

## 2023-01-13 NOTE — Telephone Encounter (Signed)
Verbal from Dr. Ronne Binning to send in daily Cipro 250 mg to take prior to surgery.  Surgery date confirmed with patient and abx sent to preferred pharmacy.  Surgery workque updated.

## 2023-01-19 ENCOUNTER — Encounter (HOSPITAL_COMMUNITY)
Admission: RE | Admit: 2023-01-19 | Discharge: 2023-01-19 | Disposition: A | Discharge status: Home / Self Care | Payer: Medicare Other | Source: Ambulatory Visit | Attending: Urology | Admitting: Urology

## 2023-01-19 ENCOUNTER — Encounter (HOSPITAL_COMMUNITY): Payer: Self-pay

## 2023-01-22 ENCOUNTER — Ambulatory Visit (HOSPITAL_COMMUNITY)
Admission: RE | Admit: 2023-01-22 | Discharge: 2023-01-22 | Disposition: A | Discharge status: Home / Self Care | Payer: Medicare Other | Attending: Urology | Admitting: Urology

## 2023-01-22 ENCOUNTER — Encounter (HOSPITAL_COMMUNITY)
Admission: RE | Disposition: A | Discharge status: Home / Self Care | Payer: Self-pay | Source: Home / Self Care | Attending: Urology

## 2023-01-22 ENCOUNTER — Ambulatory Visit (HOSPITAL_BASED_OUTPATIENT_CLINIC_OR_DEPARTMENT_OTHER): Payer: Medicare Other | Admitting: Anesthesiology

## 2023-01-22 ENCOUNTER — Ambulatory Visit (HOSPITAL_COMMUNITY): Payer: Medicare Other

## 2023-01-22 ENCOUNTER — Ambulatory Visit (HOSPITAL_COMMUNITY): Payer: Medicare Other | Admitting: Anesthesiology

## 2023-01-22 ENCOUNTER — Encounter (HOSPITAL_COMMUNITY): Payer: Self-pay | Admitting: Urology

## 2023-01-22 DIAGNOSIS — I10 Essential (primary) hypertension: Secondary | ICD-10-CM | POA: Insufficient documentation

## 2023-01-22 DIAGNOSIS — N200 Calculus of kidney: Secondary | ICD-10-CM

## 2023-01-22 DIAGNOSIS — Z87891 Personal history of nicotine dependence: Secondary | ICD-10-CM | POA: Insufficient documentation

## 2023-01-22 DIAGNOSIS — N3001 Acute cystitis with hematuria: Secondary | ICD-10-CM | POA: Insufficient documentation

## 2023-01-22 DIAGNOSIS — F32A Depression, unspecified: Secondary | ICD-10-CM | POA: Diagnosis not present

## 2023-01-22 DIAGNOSIS — E119 Type 2 diabetes mellitus without complications: Secondary | ICD-10-CM | POA: Insufficient documentation

## 2023-01-22 DIAGNOSIS — F419 Anxiety disorder, unspecified: Secondary | ICD-10-CM | POA: Diagnosis not present

## 2023-01-22 DIAGNOSIS — G4730 Sleep apnea, unspecified: Secondary | ICD-10-CM | POA: Insufficient documentation

## 2023-01-22 HISTORY — PX: CYSTOSCOPY WITH RETROGRADE PYELOGRAM, URETEROSCOPY AND STENT PLACEMENT: SHX5789

## 2023-01-22 HISTORY — PX: HOLMIUM LASER APPLICATION: SHX5852

## 2023-01-22 LAB — GLUCOSE, CAPILLARY: Glucose-Capillary: 83 mg/dL (ref 70–99)

## 2023-01-22 SURGERY — CYSTOURETEROSCOPY, WITH RETROGRADE PYELOGRAM AND STENT INSERTION
Anesthesia: General | Site: Ureter | Laterality: Left

## 2023-01-22 MED ORDER — LIDOCAINE HCL (CARDIAC) PF 100 MG/5ML IV SOSY
PREFILLED_SYRINGE | INTRAVENOUS | Status: DC | PRN
  Administered 2023-01-22: 50 mg via INTRAVENOUS

## 2023-01-22 MED ORDER — ONDANSETRON HCL 4 MG/2ML IJ SOLN: 4.0000 mg | Freq: Once | INTRAMUSCULAR | Status: DC | PRN

## 2023-01-22 MED ORDER — OXYCODONE HCL 5 MG PO TABS: 5.0000 mg | ORAL_TABLET | Freq: Once | ORAL | Status: DC | PRN

## 2023-01-22 MED ORDER — ALFUZOSIN HCL ER 10 MG PO TB24: 10.0000 mg | ORAL_TABLET | Freq: Every day | ORAL | 1 refills | Status: DC

## 2023-01-22 MED ORDER — EPHEDRINE SULFATE (PRESSORS) 50 MG/ML IJ SOLN
INTRAMUSCULAR | Status: DC | PRN
  Administered 2023-01-22 (×2): 10 mg via INTRAVENOUS

## 2023-01-22 MED ORDER — PROPOFOL 10 MG/ML IV BOLUS
INTRAVENOUS | Status: AC
  Filled 2023-01-22: qty 20

## 2023-01-22 MED ORDER — OXYCODONE-ACETAMINOPHEN 5-325 MG PO TABS: 1.0000 | ORAL_TABLET | ORAL | 0 refills | Status: DC | PRN

## 2023-01-22 MED ORDER — OXYCODONE HCL 5 MG/5ML PO SOLN: 5.0000 mg | Freq: Once | ORAL | Status: DC | PRN

## 2023-01-22 MED ORDER — GLYCOPYRROLATE PF 0.2 MG/ML IJ SOSY
PREFILLED_SYRINGE | INTRAMUSCULAR | Status: AC
  Filled 2023-01-22: qty 1

## 2023-01-22 MED ORDER — WATER FOR IRRIGATION, STERILE IR SOLN
Status: DC | PRN
  Administered 2023-01-22: 500 mL

## 2023-01-22 MED ORDER — SEVOFLURANE IN SOLN
RESPIRATORY_TRACT | Status: AC
  Filled 2023-01-22: qty 250

## 2023-01-22 MED ORDER — CHLORHEXIDINE GLUCONATE 0.12 % MT SOLN
15.0000 mL | Freq: Once | OROMUCOSAL | Status: AC
  Administered 2023-01-22: 15 mL via OROMUCOSAL
  Filled 2023-01-22: qty 15

## 2023-01-22 MED ORDER — LIDOCAINE HCL (CARDIAC) PF 100 MG/5ML IV SOSY: PREFILLED_SYRINGE | INTRAVENOUS | Status: DC | PRN

## 2023-01-22 MED ORDER — LACTATED RINGERS IV SOLN: INTRAVENOUS | Status: DC | PRN

## 2023-01-22 MED ORDER — PROPOFOL 10 MG/ML IV BOLUS
INTRAVENOUS | Status: DC | PRN
  Administered 2023-01-22: 100 mg via INTRAVENOUS
  Administered 2023-01-22: 200 mg via INTRAVENOUS

## 2023-01-22 MED ORDER — LACTATED RINGERS IV SOLN: INTRAVENOUS | Status: DC

## 2023-01-22 MED ORDER — DIATRIZOATE MEGLUMINE 30 % UR SOLN
URETHRAL | Status: DC | PRN
  Administered 2023-01-22: 10 mL via URETHRAL

## 2023-01-22 MED ORDER — SODIUM CHLORIDE 0.9 % IV SOLN
2.0000 g | INTRAVENOUS | Status: AC
  Administered 2023-01-22: 2 g via INTRAVENOUS
  Filled 2023-01-22: qty 20

## 2023-01-22 MED ORDER — SODIUM CHLORIDE 0.9 % IR SOLN
Status: DC | PRN
  Administered 2023-01-22: 3000 mL

## 2023-01-22 MED ORDER — ORAL CARE MOUTH RINSE
15.0000 mL | Freq: Once | OROMUCOSAL | Status: AC
Start: 2023-01-22 — End: 2023-01-22

## 2023-01-22 MED ORDER — FENTANYL CITRATE PF 50 MCG/ML IJ SOSY: 25.0000 ug | PREFILLED_SYRINGE | INTRAMUSCULAR | Status: DC | PRN

## 2023-01-22 SURGICAL SUPPLY — 28 items
BAG DRAIN URO TABLE W/ADPT NS (BAG) ×1 IMPLANT
BAG DRN 8 ADPR NS SKTRN CSTL (BAG) ×1
BAG HAMPER (MISCELLANEOUS) ×1 IMPLANT
CATH INTERMIT  6FR 70CM (CATHETERS) ×1 IMPLANT
CLOTH BEACON ORANGE TIMEOUT ST (SAFETY) ×1 IMPLANT
EXTRACTOR STONE NITINOL NGAGE (UROLOGICAL SUPPLIES) IMPLANT
GLOVE BIO SURGEON STRL SZ8 (GLOVE) ×1 IMPLANT
GLOVE BIOGEL PI IND STRL 7.0 (GLOVE) ×2 IMPLANT
GLOVE BIOGEL PI IND STRL 8 (GLOVE) IMPLANT
GLOVE SURG SS PI 8.0 STRL IVOR (GLOVE) IMPLANT
GOWN STRL REUS W/TWL XL LVL3 (GOWN DISPOSABLE) ×1 IMPLANT
GUIDEWIRE STR DUAL SENSOR (WIRE) ×1 IMPLANT
GUIDEWIRE STR ZIPWIRE 035X150 (MISCELLANEOUS) ×1 IMPLANT
IV NS IRRIG 3000ML ARTHROMATIC (IV SOLUTION) ×2 IMPLANT
KIT TURNOVER CYSTO (KITS) ×1 IMPLANT
MANIFOLD NEPTUNE II (INSTRUMENTS) ×1 IMPLANT
PACK CYSTO (CUSTOM PROCEDURE TRAY) ×1 IMPLANT
PAD ARMBOARD 7.5X6 YLW CONV (MISCELLANEOUS) ×1 IMPLANT
POSITIONER HEAD 8X9X4 ADT (SOFTGOODS) ×1 IMPLANT
SHEATH NAVIGATOR HD 11/13X36 (SHEATH) IMPLANT
SHEATH URETERAL 12FRX35CM (MISCELLANEOUS) IMPLANT
STENT URET 6FRX26 CONTOUR (STENTS) IMPLANT
SYR 10ML LL (SYRINGE) ×1 IMPLANT
SYR CONTROL 10ML LL (SYRINGE) ×1 IMPLANT
TOWEL OR 17X26 4PK STRL BLUE (TOWEL DISPOSABLE) ×1 IMPLANT
TRACTIP FLEXIVA PULS ID 200XHI (Laser) IMPLANT
TRACTIP FLEXIVA PULSE ID 200 (Laser) ×1
WATER STERILE IRR 500ML POUR (IV SOLUTION) ×1 IMPLANT

## 2023-01-22 NOTE — Anesthesia Preprocedure Evaluation (Signed)
Anesthesia Evaluation  Patient identified by MRN, date of birth, ID band Patient awake    Reviewed: Allergy & Precautions, H&P , NPO status , Patient's Chart, lab work & pertinent test results, reviewed documented beta blocker date and time   Airway Mallampati: II  TM Distance: >3 FB Neck ROM: full    Dental no notable dental hx.    Pulmonary neg pulmonary ROS, sleep apnea , former smoker   Pulmonary exam normal breath sounds clear to auscultation       Cardiovascular Exercise Tolerance: Good hypertension, negative cardio ROS  Rhythm:regular Rate:Normal     Neuro/Psych  PSYCHIATRIC DISORDERS Anxiety Depression    negative neurological ROS  negative psych ROS   GI/Hepatic negative GI ROS, Neg liver ROS,,,  Endo/Other  negative endocrine ROSdiabetes    Renal/GU Renal diseasenegative Renal ROS  negative genitourinary   Musculoskeletal   Abdominal   Peds  Hematology negative hematology ROS (+) Blood dyscrasia, anemia   Anesthesia Other Findings   Reproductive/Obstetrics negative OB ROS                             Anesthesia Physical Anesthesia Plan  ASA: 2  Anesthesia Plan: General and General LMA   Post-op Pain Management:    Induction:   PONV Risk Score and Plan: Ondansetron  Airway Management Planned:   Additional Equipment:   Intra-op Plan:   Post-operative Plan:   Informed Consent: I have reviewed the patients History and Physical, chart, labs and discussed the procedure including the risks, benefits and alternatives for the proposed anesthesia with the patient or authorized representative who has indicated his/her understanding and acceptance.     Dental Advisory Given  Plan Discussed with: CRNA  Anesthesia Plan Comments:        Anesthesia Quick Evaluation

## 2023-01-22 NOTE — Op Note (Signed)
Preoperative diagnosis: Left renal stones   Postoperative diagnosis: Same   Procedure: 1 cystoscopy 2. Left retrograde pyelography 3.  Intraoperative fluoroscopy, under one hour, with interpretation 4.  Left ureteroscopic stone manipulation with laser lithotripsy 5.  Left 6 x 26 JJ stent placement   Attending: Wilkie Aye   Anesthesia: General   Estimated blood loss: None   Drains: Left 6 x 26 JJ ureteral stent with tether   Specimens: stone for analysis   Antibiotics: rocephin   Findings: left lower and mid pole stones. No hydronephrosis. No masses/lesions in the bladder. Ureteral orifices in normal anatomic location.   Indications: Patient is a 77 year old female with a history of left renal stone who underwent left stent placement 6 weeks ago.  After discussing treatment options, she decided proceed with left ureteroscopic stone manipulation.   Procedure in detail: The patient was brought to the operating room and a brief timeout was done to ensure correct patient, correct procedure, correct site.  General anesthesia was administered patient was placed in dorsal lithotomy position.  Her genitalia was then prepped and draped in usual sterile fashion.  A rigid 22 French cystoscope was passed in the urethra and the bladder.  Bladder was inspected free masses or lesions.  the ureteral orifices were in the normal orthotopic locations.  a 6 french ureteral catheter was then instilled into the left ureteral orifice.  a gentle retrograde was obtained and findings noted above. Using a grasper the left ureteral stent was brought to the urethral meatus.  we then placed a zip wire through the ureteral stent and advanced up to the renal pelvis. The stent was removed.  we then removed the cystoscope and cannulated the left ureteral orifice with a semirigid ureteroscope.  No stone was found in the ureter. Once we reached the UPJ a sensor wire was advanced in to the renal pelvis. We then removed  the ureteroscope and advanced am 12/14 x 38cm access sheath up to the renal pelvis. We then used the flexible ureteroscope to perform nephroscopy. We encountered the stones in the mid and lower poles.  Using a 242nm laser fiber the stones were dusted.  Once this was complete we then removed the access sheath under direct vision and noted no injury to the ureter. We then placed a 6 x 26 double-j ureteral stent over the original zip wire.  We then removed the wire and good coil was noted in the the renal pelvis under fluoroscopy and the bladder under direct vision. the bladder was then drained and this concluded the procedure which was well tolerated by patient.   Complications: None   Condition: Stable, extubated, transferred to PACU   Plan: Patient is to be discharged home as to follow-up in one week. She is to remove her stent in 72 hours by gently pulling the tether

## 2023-01-22 NOTE — Anesthesia Procedure Notes (Addendum)
Procedure Name: LMA Insertion Date/Time: 01/22/2023 2:53 PM  Performed by: Moshe Salisbury, CRNAPre-anesthesia Checklist: Patient identified, Patient being monitored, Emergency Drugs available, Timeout performed and Suction available Patient Re-evaluated:Patient Re-evaluated prior to induction Oxygen Delivery Method: Circle System Utilized Preoxygenation: Pre-oxygenation with 100% oxygen Induction Type: IV induction Ventilation: Mask ventilation without difficulty LMA: LMA inserted LMA Size: 4.0 Number of attempts: 1 Placement Confirmation: positive ETCO2 and breath sounds checked- equal and bilateral

## 2023-01-22 NOTE — Transfer of Care (Signed)
Immediate Anesthesia Transfer of Care Note  Patient: Felicia Hogan  Procedure(s) Performed: CYSTOSCOPY WITH RETROGRADE PYELOGRAM, URETEROSCOPY AND STENT PLACEMENT (Left: Ureter) HOLMIUM LASER APPLICATION (Left: Ureter)  Patient Location: PACU  Anesthesia Type:General  Level of Consciousness: awake  Airway & Oxygen Therapy: Patient Spontanous Breathing  Post-op Assessment: Report given to RN  Post vital signs: Reviewed  Last Vitals:  Vitals Value Taken Time  BP 124/55 01/22/23 1541  Temp    Pulse 76 01/22/23 1542  Resp 8 01/22/23 1542  SpO2 100 % 01/22/23 1542  Vitals shown include unfiled device data.  Last Pain:  Vitals:   01/22/23 1208  TempSrc: Oral  PainSc: 0-No pain      Patients Stated Pain Goal: 5 (01/22/23 1208)  Complications: No notable events documented.

## 2023-01-22 NOTE — Interval H&P Note (Signed)
History and Physical Interval Note:  01/22/2023 2:19 PM  Felicia Hogan  has presented today for surgery, with the diagnosis of Left Nephrolithiasis.  The various methods of treatment have been discussed with the patient and family. After consideration of risks, benefits and other options for treatment, the patient has consented to  Procedure(s): CYSTOSCOPY WITH RETROGRADE PYELOGRAM, URETEROSCOPY AND STENT PLACEMENT (Left) HOLMIUM LASER APPLICATION (Left) as a surgical intervention.  The patient's history has been reviewed, patient examined, no change in status, stable for surgery.  I have reviewed the patient's chart and labs.  Questions were answered to the patient's satisfaction.     Wilkie Aye

## 2023-01-23 ENCOUNTER — Encounter (HOSPITAL_COMMUNITY): Payer: Self-pay | Admitting: Urology

## 2023-01-26 ENCOUNTER — Ambulatory Visit: Payer: Medicare Other

## 2023-01-26 ENCOUNTER — Ambulatory Visit: Payer: Medicare Other | Attending: Internal Medicine | Admitting: Internal Medicine

## 2023-01-26 ENCOUNTER — Encounter: Payer: Self-pay | Admitting: Internal Medicine

## 2023-01-26 VITALS — BP 122/70 | HR 92 | Ht 65.0 in | Wt 196.2 lb

## 2023-01-26 DIAGNOSIS — R55 Syncope and collapse: Secondary | ICD-10-CM

## 2023-01-26 DIAGNOSIS — Z01810 Encounter for preprocedural cardiovascular examination: Secondary | ICD-10-CM | POA: Diagnosis not present

## 2023-01-26 NOTE — Patient Instructions (Signed)
Medication Instructions:  Your physician recommends that you continue on your current medications as directed. Please refer to the Current Medication list given to you today.  *If you need a refill on your cardiac medications before your next appointment, please call your pharmacy*   Lab Work: None If you have labs (blood work) drawn today and your tests are completely normal, you will receive your results only by: MyChart Message (if you have MyChart) OR A paper copy in the mail If you have any lab test that is abnormal or we need to change your treatment, we will call you to review the results.   Testing/Procedures: Your physician has requested that you have an echocardiogram. Echocardiography is a painless test that uses sound waves to create images of your heart. It provides your doctor with information about the size and shape of your heart and how well your heart's chambers and valves are working. This procedure takes approximately one hour. There are no restrictions for this procedure. Please do NOT wear cologne, perfume, aftershave, or lotions (deodorant is allowed). Please arrive 15 minutes prior to your appointment time.  Please note: We ask at that you not bring children with you during ultrasound (echo/ vascular) testing. Due to room size and safety concerns, children are not allowed in the ultrasound rooms during exams. Our front office staff cannot provide observation of children in our lobby area while testing is being conducted. An adult accompanying a patient to their appointment will only be allowed in the ultrasound room at the discretion of the ultrasound technician under special circumstances. We apologize for any inconvenience.  Zio Monitor   Follow-Up: At [facility], you and your health needs are our priority.  As part of our continuing mission to provide you with exceptional heart care, we have created designated Provider Care Teams.  These Care Teams  include your primary Cardiologist (physician) and Advanced Practice Providers (APPs -  Physician Assistants and Nurse Practitioners) who all work together to provide you with the care you need, when you need it.  We recommend signing up for the patient portal called "MyChart".  Sign up information is provided on this After Visit Summary.  MyChart is used to connect with patients for Virtual Visits (Telemedicine).  Patients are able to view lab/test results, encounter notes, upcoming appointments, etc.  Non-urgent messages can be sent to your provider as well.   To learn more about what you can do with MyChart, go to [email or web address].    Your next appointment:    Follow up pending testing results  Provider:   You may see Vishnu P Mallipeddi, MD or one of the following Advanced Practice Providers on your designated Care Team:   [place], PA-C  Jacolyn Reedy, [place]     Other Instructions

## 2023-01-26 NOTE — Progress Notes (Signed)
Cardiology Office Note  Date: 01/26/2023   ID: Felicia Hogan, DOB 02-Jul-1945, MRN [id number]  PCP:  Alinda Deem, MD  Cardiologist:  Marjo Bicker, MD Electrophysiologist:  None   History of Present Illness: Felicia Hogan is a 77 y.o. female known to have DM 2, HLD was referred to cardiology clinic post ER visit for syncope.   Patient had ER visit in 10/2022 for syncope, BP was 90s millimeters mercury SBP and after 1.5 L of IV fluids were given, her symptoms resolved.  She was on amlodipine which was discontinued upon discharge.  She did have any recurrence since then.  Her hypertension symptoms are likely related to hypotension from amlodipine use.  She also lost a significant amount of weight from [place] use prior to that.  No true angina, DOE, orthopnea, PND, palpitations.  She underwent cystoscopy twice in 9/24 in 10/24 with no complications.  She is supposed to undergo parathyroid surgery at [facility] in the early December 2024.  Past Medical History:  Diagnosis Date   Acute pyelonephritis 01/24/2018   Anxiety    Arthritis    Depression    Diabetes mellitus without complication (HCC)    Dyslipidemia    History of kidney stones    Hypertension    Iron deficiency anemia due to chronic blood loss    Mucous membrane pemphigoid 2014   so far only infects mouth. treated with oral steroids for flares. Lidex as well.    Sleep apnea     Past Surgical History:  Procedure Laterality Date   BIOPSY  11/17/2017   Procedure: BIOPSY;  Surgeon: West Bali, MD;  [facility];  Service: Endoscopy;;  gastric duodenum   CESAREAN SECTION     twice   CHOLECYSTECTOMY     COLONOSCOPY  10/2012   Dr. Aleene Davidson: normal. due 10/2017.    COLONOSCOPY WITH PROPOFOL N/A 11/17/2017   Procedure: COLONOSCOPY WITH PROPOFOL;  Surgeon: West Bali, MD;  [facility];  Service: Endoscopy;  Laterality: N/A;  8:45am   CYSTOSCOPY WITH RETROGRADE PYELOGRAM, URETEROSCOPY AND  STENT PLACEMENT Left 12/04/2022   Procedure: CYSTOSCOPY WITH RETROGRADE PYELOGRAM, URETEROSCOPY AND STENT PLACEMENT;  Surgeon: Malen Gauze, MD;  [facility]: [facility];  Service: Urology;  Laterality: Left;   CYSTOSCOPY WITH RETROGRADE PYELOGRAM, URETEROSCOPY AND STENT PLACEMENT Left 01/22/2023   Procedure: CYSTOSCOPY WITH RETROGRADE PYELOGRAM, URETEROSCOPY AND STENT PLACEMENT;  Surgeon: Malen Gauze, MD;  [facility]: [facility];  Service: Urology;  Laterality: Left;   ESOPHAGOGASTRODUODENOSCOPY (EGD) WITH PROPOFOL N/A 11/17/2017   Procedure: ESOPHAGOGASTRODUODENOSCOPY (EGD) WITH PROPOFOL;  Surgeon: West Bali, MD;  [facility];  Service: Endoscopy;  Laterality: N/A;   GIVENS CAPSULE STUDY N/A 11/30/2017   Procedure: GIVENS CAPSULE STUDY;  Surgeon: West Bali, MD;  [facility];  Service: Endoscopy;  Laterality: N/A;  7:30am   HOLMIUM LASER APPLICATION Left 12/04/2022   Procedure: HOLMIUM LASER APPLICATION;  Surgeon: Malen Gauze, MD;  [facility];  Service: Urology;  Laterality: Left;   HOLMIUM LASER APPLICATION Left 01/22/2023   Procedure: HOLMIUM LASER APPLICATION;  Surgeon: Malen Gauze, MD;  [facility]: [facility];  Service: Urology;  Laterality: Left;   LITHOTRIPSY  2017   POLYPECTOMY  11/17/2017   Procedure: POLYPECTOMY;  Surgeon: West Bali, MD;  [facility];  Service: Endoscopy;;  colon    REPLACEMENT TOTAL KNEE BILATERAL     TONSILLECTOMY     TOTAL HIP ARTHROPLASTY  Right     Current Outpatient Medications  Medication Sig Dispense Refill   acetaminophen (TYLENOL) 650 MG CR tablet Take 1,300 mg by mouth daily as needed for pain.     alfuzosin (UROXATRAL) 10 MG 24 hr tablet Take 1 tablet (10 mg total) by mouth at bedtime. 30 tablet 1   Bacillus Coagulans-Inulin (PROBIOTIC-PREBIOTIC PO) Take 1 capsule by mouth daily. 31 billion CFU     BIOTIN PO Take 10,000 mcg by mouth 2 (two) times daily.     COLLAGEN PO Take 1 Scoop by  mouth daily.     desvenlafaxine (PRISTIQ) 50 MG 24 hr tablet Take 50 mg by mouth daily.     estradiol (ESTRACE) 0.1 MG/GM vaginal cream Place 1 Applicatorful vaginally 2 (two) times a week.     GEMTESA 75 MG TABS Take 75 mg by mouth daily.     indapamide (LOZOL) 2.5 MG tablet Take 1 tablet by mouth once daily 30 tablet 0   Multiple Vitamins-Minerals (PRESERVISION AREDS PO) Take 1 capsule by mouth 2 (two) times daily.      Omega-3 Fatty Acids (FISH OIL) 1200 MG CAPS Take 1,200 mg by mouth 2 (two) times daily.     oxyCODONE-acetaminophen (PERCOCET) 5-325 MG tablet Take 1 tablet by mouth every 4 (four) hours as needed. 30 tablet 0   Potassium Citrate (UROCIT-K 15) 15 MEQ (1620 MG) TBCR Take 1 tablet by mouth 2 (two) times daily. 60 tablet 11   tirzepatide (MOUNJARO) 15 MG/0.5ML Pen Inject 15 mg into the skin every Sunday.     TURMERIC CURCUMIN PO Take 900 mg by mouth 2 (two) times daily.     ciprofloxacin (CIPRO) 250 MG tablet Take 1 tablet (250 mg total) by mouth daily. Take daily until scheduled surgery on 10/31 10 tablet 0   No current facility-administered medications for this visit.   Allergies:  Nsaids, Sulfa antibiotics, Erythromycin, Tamsulosin, and Tape   Social History: The patient  reports that she quit smoking about 55 years ago. Her smoking use included cigarettes. She started smoking about 56 years ago. She has a 0.3 pack-year smoking history. She has never used smokeless tobacco. She reports current alcohol use. She reports that she does not use drugs.   Family History: The patient's family history includes Colon cancer (age of onset: 71) in her father; Colon cancer (age of onset: 76) in her paternal aunt; Liver cancer in her mother; Other (age of onset: 22) in her daughter; Parkinson's disease in her father; Pneumonia in her father; Thyroid cancer in her father.   ROS:  Please see the history of present illness. Otherwise, complete review of systems is positive for none.  All other  systems are reviewed and negative.   Physical Exam: VS:  BP 122/70 (BP Location: Left Arm)   Pulse 92   Ht 5\' 5"  (1.651 m)   Wt 196 lb 3.2 oz (89 kg)   SpO2 97%   BMI 32.65 kg/m , BMI Body mass index is 32.65 kg/m.  Wt Readings from Last 3 Encounters:  01/26/23 196 lb 3.2 oz (89 kg)  01/22/23 182 lb 8 oz (82.8 kg)  01/19/23 185 lb (83.9 kg)    General: Patient appears comfortable at rest. HEENT: Conjunctiva and lids normal, oropharynx clear with moist mucosa. Neck: Supple, no elevated JVP or carotid bruits, no thyromegaly. Lungs: Clear to auscultation, nonlabored breathing at rest. Cardiac: Regular rate and rhythm, no S3 or significant systolic murmur, no pericardial rub. Abdomen: Soft, nontender,  no hepatomegaly, bowel sounds present, no guarding or rebound. Extremities: No pitting edema, distal pulses 2+. Skin: Warm and dry. Musculoskeletal: No kyphosis. Neuropsychiatric: Alert and oriented x3, affect grossly appropriate.  Recent Labwork: 10/23/2022: B Natriuretic Peptide 20.0; Hemoglobin 14.6; Platelets 327 12/23/2022: BUN 28; Creatinine, Ser 0.77; Potassium 4.4; Sodium 140  No results found for: "CHOL", "TRIG", "HDL", "CHOLHDL", "VLDL", "LDLCALC", "LDLDIRECT"   Assessment and Plan:   Preop cardiac risk stratification for parathyroid surgery Syncope   -Patient had ER visit in 10/2022 for syncope, BP 90s SBP. 1.5L IVF was administered with resolution of symptoms.Amlodipine was discontinued upon discharge.  No recurrence of symptoms since then. Echocardiogram was performed in 2021 that was normal and no significant valvular heart disease was noted.  Will repeat echocardiogram and obtain 2-week event monitor, live to r/o any conduction abnormalities. Syncope most likely from hypotension from amlodipine use but will need to r/o any conduction abnormalities. EKG today showed NSR and no AVB. No angina, no need of stress testing.  Patient already underwent cystoscopy twice in  September 2024 and October 2024 with no complications. Patient is at a low risk for any perioperative cardiac complications (if echo is normal).   I spent a total duration of 45 minutes reviewing the prior notes, labs, EKG, ER visits, face-to-face discussion/counseling of her medical condition, pathophysiology, evaluation, management, ordering test and documenting the findings in the note.  Disposition:  Follow up pending results  Signed, Chauntay Paszkiewicz Verne Spurr, MD, 01/26/2023 3:35 PM    [facility] at [facility] [address], [place], [place] [address]

## 2023-01-27 ENCOUNTER — Ambulatory Visit (HOSPITAL_COMMUNITY): Payer: Medicare Other

## 2023-01-27 ENCOUNTER — Encounter (HOSPITAL_COMMUNITY): Payer: Self-pay

## 2023-01-27 NOTE — Anesthesia Postprocedure Evaluation (Signed)
Anesthesia Post Note  Patient: Felicia Hogan  Procedure(s) Performed: CYSTOSCOPY WITH RETROGRADE PYELOGRAM, URETEROSCOPY AND STENT PLACEMENT (Left: Ureter) HOLMIUM LASER APPLICATION (Left: Ureter)  Patient location during evaluation: Phase II Anesthesia Type: General Level of consciousness: awake Pain management: pain level controlled Vital Signs Assessment: post-procedure vital signs reviewed and stable Respiratory status: spontaneous breathing and respiratory function stable Cardiovascular status: blood pressure returned to baseline and stable Postop Assessment: no headache and no apparent nausea or vomiting Anesthetic complications: no Comments: Late entry   No notable events documented.   Last Vitals:  Vitals:   01/22/23 1600 01/22/23 1610  BP: (!) 120/58 122/77  Pulse: 78 71  Resp: 12 12  Temp:  36.4 C  SpO2: 99% 97%    Last Pain:  Vitals:   01/23/23 1309  TempSrc:   PainSc: 0-No pain                 Windell Norfolk

## 2023-01-28 ENCOUNTER — Ambulatory Visit (INDEPENDENT_AMBULATORY_CARE_PROVIDER_SITE_OTHER): Payer: Medicare Other | Admitting: Urology

## 2023-01-28 VITALS — BP 139/78 | HR 85

## 2023-01-28 DIAGNOSIS — N200 Calculus of kidney: Secondary | ICD-10-CM | POA: Diagnosis not present

## 2023-01-28 LAB — URINALYSIS, ROUTINE W REFLEX MICROSCOPIC
Bilirubin, UA: NEGATIVE
Glucose, UA: NEGATIVE
Ketones, UA: NEGATIVE
Nitrite, UA: NEGATIVE
Protein,UA: NEGATIVE
Specific Gravity, UA: 1.01 (ref 1.005–1.030)
Urobilinogen, Ur: 0.2 mg/dL (ref 0.2–1.0)
pH, UA: 7 (ref 5.0–7.5)

## 2023-01-28 LAB — MICROSCOPIC EXAMINATION

## 2023-01-28 NOTE — Progress Notes (Signed)
01/28/2023 2:05 PM   Felicia Hogan [birth year] [id number]  Referring provider: Alinda Deem, MD [address] [place],  [place] [address]  Followup nephrolithiasis   HPI: Ms Lamprecht is a 77yo here for followup for nephrolithiasis. She removed her stent POD#3. She denies any flank pain. NO worsening LUTS. Hematuria resolved   PMH: Past Medical History:  Diagnosis Date   Acute pyelonephritis 01/24/2018   Anxiety    Arthritis    Depression    Diabetes mellitus without complication (HCC)    Dyslipidemia    History of kidney stones    Hypertension    Iron deficiency anemia due to chronic blood loss    Mucous membrane pemphigoid 2014   so far only infects mouth. treated with oral steroids for flares. Lidex as well.    Sleep apnea     Surgical History: Past Surgical History:  Procedure Laterality Date   BIOPSY  11/17/2017   Procedure: BIOPSY;  Surgeon: West Bali, MD;  [facility];  Service: Endoscopy;;  gastric duodenum   CESAREAN SECTION     twice   CHOLECYSTECTOMY     COLONOSCOPY  10/2012   Dr. Aleene Davidson: normal. due 10/2017.    COLONOSCOPY WITH PROPOFOL N/A 11/17/2017   Procedure: COLONOSCOPY WITH PROPOFOL;  Surgeon: West Bali, MD;  [facility];  Service: Endoscopy;  Laterality: N/A;  8:45am   CYSTOSCOPY WITH RETROGRADE PYELOGRAM, URETEROSCOPY AND STENT PLACEMENT Left 12/04/2022   Procedure: CYSTOSCOPY WITH RETROGRADE PYELOGRAM, URETEROSCOPY AND STENT PLACEMENT;  Surgeon: Malen Gauze, MD;  [facility]: [facility];  Service: Urology;  Laterality: Left;   CYSTOSCOPY WITH RETROGRADE PYELOGRAM, URETEROSCOPY AND STENT PLACEMENT Left 01/22/2023   Procedure: CYSTOSCOPY WITH RETROGRADE PYELOGRAM, URETEROSCOPY AND STENT PLACEMENT;  Surgeon: Malen Gauze, MD;  [facility]: [facility];  Service: Urology;  Laterality: Left;   ESOPHAGOGASTRODUODENOSCOPY (EGD) WITH PROPOFOL N/A 11/17/2017   Procedure: ESOPHAGOGASTRODUODENOSCOPY (EGD) WITH PROPOFOL;   Surgeon: West Bali, MD;  [facility];  Service: Endoscopy;  Laterality: N/A;   GIVENS CAPSULE STUDY N/A 11/30/2017   Procedure: GIVENS CAPSULE STUDY;  Surgeon: West Bali, MD;  [facility];  Service: Endoscopy;  Laterality: N/A;  7:30am   HOLMIUM LASER APPLICATION Left 12/04/2022   Procedure: HOLMIUM LASER APPLICATION;  Surgeon: Malen Gauze, MD;  [facility]: [facility];  Service: Urology;  Laterality: Left;   HOLMIUM LASER APPLICATION Left 01/22/2023   Procedure: HOLMIUM LASER APPLICATION;  Surgeon: Malen Gauze, MD;  Location: [facility];  Service: Urology;  Laterality: Left;   LITHOTRIPSY  2017   POLYPECTOMY  11/17/2017   Procedure: POLYPECTOMY;  Surgeon: West Bali, MD;  [facility];  Service: Endoscopy;;  colon    REPLACEMENT TOTAL KNEE BILATERAL     TONSILLECTOMY     TOTAL HIP ARTHROPLASTY Right     Home Medications:  Allergies as of 01/28/2023       Reactions   Nsaids Itching, Rash   If given in large doses.    Sulfa Antibiotics Rash   Erythromycin Hives, Rash   Tamsulosin Itching, Rash   Tape Itching, Rash        Medication List        Accurate as of January 28, 2023  2:05 PM. If you have any questions, ask your nurse or doctor.          acetaminophen 650 MG CR tablet Commonly known as: TYLENOL Take 1,300 mg by mouth daily  as needed for pain.   alfuzosin 10 MG 24 hr tablet Commonly known as: UROXATRAL Take 1 tablet (10 mg total) by mouth at bedtime.   BIOTIN PO Take 10,000 mcg by mouth 2 (two) times daily.   ciprofloxacin 250 MG tablet Commonly known as: Cipro Take 1 tablet (250 mg total) by mouth daily. Take daily until scheduled surgery on 10/31   COLLAGEN PO Take 1 Scoop by mouth daily.   desvenlafaxine 50 MG 24 hr tablet Commonly known as: PRISTIQ Take 50 mg by mouth daily.   estradiol 0.1 MG/GM vaginal cream Commonly known as: ESTRACE Place 1 Applicatorful vaginally 2 (two) times a week.    Fish Oil 1200 MG Caps Take 1,200 mg by mouth 2 (two) times daily.   Gemtesa 75 MG Tabs Generic drug: Vibegron Take 75 mg by mouth daily.   indapamide 2.5 MG tablet Commonly known as: LOZOL Take 1 tablet by mouth once daily   oxyCODONE-acetaminophen 5-325 MG tablet Commonly known as: Percocet Take 1 tablet by mouth every 4 (four) hours as needed.   Potassium Citrate 15 MEQ (1620 MG) Tbcr Commonly known as: Urocit-K 15 Take 1 tablet by mouth 2 (two) times daily.   PRESERVISION AREDS PO Take 1 capsule by mouth 2 (two) times daily.   PROBIOTIC-PREBIOTIC PO Take 1 capsule by mouth daily. 31 billion CFU   tirzepatide 15 MG/0.5ML Pen Commonly known as: MOUNJARO Inject 15 mg into the skin every Sunday.   TURMERIC CURCUMIN PO Take 900 mg by mouth 2 (two) times daily.        Allergies:  Allergies  Allergen Reactions   Nsaids Itching and Rash    If given in large doses.     Sulfa Antibiotics Rash   Erythromycin Hives and Rash   Tamsulosin Itching and Rash   Tape Itching and Rash    Family History: Family History  Problem Relation Age of Onset   Liver cancer Mother        ?started in pancreas   Parkinson's disease Father    Pneumonia Father    Colon cancer Father 25       metastatic but did well   Thyroid cancer Father    Other Daughter 10       brain tumor, followed with serial MRIs. now age 27   Colon cancer Paternal Aunt 64    Social History:  reports that she quit smoking about 55 years ago. Her smoking use included cigarettes. She started smoking about 56 years ago. She has a 0.3 pack-year smoking history. She has never used smokeless tobacco. She reports current alcohol use. She reports that she does not use drugs.  ROS: All other review of systems were reviewed and are negative except what is noted above in HPI  Physical Exam: BP 139/78   Pulse 85   Constitutional:  Alert and oriented, No acute distress. HEENT: [place] AT, moist mucus membranes.   Trachea midline, no masses. Cardiovascular: No clubbing, cyanosis, or edema. Respiratory: Normal respiratory effort, no increased work of breathing. GI: Abdomen is soft, nontender, nondistended, no abdominal masses GU: No CVA tenderness.  Lymph: No cervical or inguinal lymphadenopathy. Skin: No rashes, bruises or suspicious lesions. Neurologic: Grossly intact, no focal deficits, moving all 4 extremities. Psychiatric: Normal mood and affect.  Laboratory Data: Lab Results  Component Value Date   WBC 8.8 10/23/2022   HGB 14.6 10/23/2022   HCT 44.8 10/23/2022   MCV 86.8 10/23/2022   PLT 327 10/23/2022  Lab Results  Component Value Date   CREATININE 0.77 12/23/2022    No results found for: "PSA"  No results found for: "TESTOSTERONE"  No results found for: "HGBA1C"  Urinalysis    Component Value Date/Time   COLORURINE YELLOW 10/23/2022 1856   APPEARANCEUR Cloudy (A) 01/05/2023 1359   LABSPEC 1.010 10/23/2022 1856   PHURINE 6.0 10/23/2022 1856   GLUCOSEU Negative 01/05/2023 1359   HGBUR MODERATE (A) 10/23/2022 1856   BILIRUBINUR Negative 01/05/2023 1359   KETONESUR NEGATIVE 10/23/2022 1856   PROTEINUR 1+ (A) 01/05/2023 1359   PROTEINUR 100 (A) 10/23/2022 1856   NITRITE Positive (A) 01/05/2023 1359   NITRITE NEGATIVE 10/23/2022 1856   LEUKOCYTESUR 3+ (A) 01/05/2023 1359   LEUKOCYTESUR LARGE (A) 10/23/2022 1856    Lab Results  Component Value Date   LABMICR See below: 01/05/2023   WBCUA >30 (A) 01/05/2023   LABEPIT 0-10 01/05/2023   BACTERIA Moderate (A) 01/05/2023    Pertinent Imaging:  Results for orders placed during the hospital encounter of 10/22/22  Abdomen 1 view (KUB)  Narrative CLINICAL DATA:  Nephrolithiasis  EXAM: ABDOMEN - 1 VIEW  COMPARISON:  07/23/2022, 04/14/2022, CT 01/25/2018  FINDINGS: Right hip replacement with normal alignment. Nonobstructed gas pattern with moderate stool. Redemonstrated bilateral kidney stones. 11 mm stone  projecting over upper pole of right kidney. Punctate stone projects over lower pole right kidney. Dominant 15 mm left kidney stone with suspected smaller 8 mm stone. Findings do not appear significantly changed.  IMPRESSION: Bilateral kidney stones, not significantly changed.   Electronically Signed By: Jasmine Pang M.D. On: 10/23/2022 19:17  No results found for this or any previous visit.  No results found for this or any previous visit.  No results found for this or any previous visit.  No results found for this or any previous visit.  No valid procedures specified. No results found for this or any previous visit.  Results for orders placed in visit on 12/17/22  CT RENAL STONE STUDY  Narrative CLINICAL DATA:  Urinary tract calculi.  EXAM: CT ABDOMEN AND PELVIS WITHOUT CONTRAST  TECHNIQUE: Multidetector CT imaging of the abdomen and pelvis was performed following the standard protocol without IV contrast.  RADIATION DOSE REDUCTION: This exam was performed according to the departmental dose-optimization program which includes automated exposure control, adjustment of the mA and/or kV according to patient size and/or use of iterative reconstruction technique.  COMPARISON:  01/24/2018 and abdomen radiograph from 10/22/2022  FINDINGS: Lower chest: Stable 5 by 3 mm subpleural nodule in the left lower lobe on image 19 series 4, stability over the last 5 years implies benign etiology. No further imaging workup of this lesion is indicated.  Mitral valve calcification.  Hepatobiliary: Cholecystectomy.  Otherwise unremarkable.  Pancreas: Unremarkable  Spleen: Unremarkable  Adrenals/Urinary Tract: 1.5 cm Bosniak category 1 cyst of the right kidney lower pole anteriorly on image 43 series 2. No further imaging workup of this lesion is indicated.  Obscuration of the right distal ureter and right side of the urinary bladder due to streak artifact from the  patient's right hip implant.  Three nonobstructive right renal calculi are present, largest measures 1.1 cm in the upper pole.  Three nonobstructive left kidney lower pole calculi are observed with some staghorn characteristics, the largest is 1.6 cm in the lower pole.  Left double-J ureteral stent is present, no definite ureteral stone along the margin of the stent identified. Minimal caliectasis on the left without hydroureter. Stent positioning  satisfactory.  Stomach/Bowel: Lax anterior abdominal wall with rectus diastasis, abdominal contents overhang the proximal femurs through this laxity along the pannus. Air fluid levels in the sigmoid colon which could reflect diarrheal process. No dilated bowel.  Vascular/Lymphatic: Atherosclerosis is present, including aortoiliac atherosclerotic disease.  Reproductive: Unremarkable  Other: No supplemental non-categorized findings.  Musculoskeletal: Right total hip prosthesis. 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 Grade 1 degenerative retrolisthesis at L2-3 and grade 1 degenerative anterolisthesis at L4-5.  IMPRESSION: 1. Bilateral nonobstructive nephrolithiasis. 2. Left double-J ureteral stent in place, no definite ureteral stone along the margin of the stent identified. Minimal caliectasis on the left without hydroureter. 3. Air fluid levels in the sigmoid colon which could reflect diarrheal process. 4. Aortic atherosclerosis. 5. 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 6. Lax anterior abdominal wall with rectus diastasis, abdominal contents overhang the proximal femurs through this laxity along the pannus.  Aortic Atherosclerosis (ICD10-I70.0).   Electronically Signed By: Gaylyn Rong M.D. On: 12/17/2022 13:43   Assessment & Plan:    1. Renal stone Followup 6 weeks with a renal US -  Urinalysis, Routine w reflex microscopic   No follow-ups on file.  Wilkie Aye, MD  [facility] [place]

## 2023-01-30 LAB — URINE CULTURE

## 2023-02-01 ENCOUNTER — Encounter: Payer: Self-pay | Admitting: Urology

## 2023-02-01 NOTE — Patient Instructions (Signed)

## 2023-02-05 ENCOUNTER — Ambulatory Visit (HOSPITAL_COMMUNITY)
Admission: RE | Admit: 2023-02-05 | Discharge: 2023-02-05 | Disposition: A | Discharge status: Home / Self Care | Payer: Medicare Other | Source: Ambulatory Visit | Attending: Urology | Admitting: Urology

## 2023-02-05 DIAGNOSIS — N200 Calculus of kidney: Secondary | ICD-10-CM | POA: Insufficient documentation

## 2023-02-11 ENCOUNTER — Ambulatory Visit (HOSPITAL_COMMUNITY)
Admission: RE | Admit: 2023-02-11 | Discharge: 2023-02-11 | Disposition: A | Discharge status: Home / Self Care | Payer: Medicare Other | Source: Ambulatory Visit | Attending: Internal Medicine | Admitting: Internal Medicine

## 2023-02-11 DIAGNOSIS — R55 Syncope and collapse: Secondary | ICD-10-CM | POA: Insufficient documentation

## 2023-02-11 LAB — ECHOCARDIOGRAM COMPLETE
Area-P 1/2: 2.83 cm2
S' Lateral: 2.4 cm

## 2023-02-11 NOTE — Progress Notes (Signed)
*  PRELIMINARY RESULTS* Echocardiogram 2D Echocardiogram has been performed.  Stacey Drain 02/11/2023, 10:24 AM

## 2023-02-16 ENCOUNTER — Other Ambulatory Visit: Payer: Self-pay | Admitting: Urology

## 2023-02-17 ENCOUNTER — Telehealth: Payer: Self-pay | Admitting: Internal Medicine

## 2023-02-17 NOTE — Telephone Encounter (Signed)
Calling to report abnormal Zio results for pt. Caller hung up before call could be transferred.

## 2023-02-17 NOTE — Telephone Encounter (Signed)
Talked to [facility] who stated that pt had an episode of SVT on 11/13 at 9:15 am. Episode was asymptomatic. Will send strip to provider once report is posted.

## 2023-02-18 ENCOUNTER — Encounter: Payer: Self-pay | Admitting: Internal Medicine

## 2023-02-22 DIAGNOSIS — I471 Supraventricular tachycardia, unspecified: Secondary | ICD-10-CM

## 2023-02-22 HISTORY — DX: Supraventricular tachycardia, unspecified: I47.10

## 2023-03-04 ENCOUNTER — Ambulatory Visit: Payer: Medicare Other | Admitting: Urology

## 2023-03-04 VITALS — BP 118/68 | HR 101

## 2023-03-04 DIAGNOSIS — N200 Calculus of kidney: Secondary | ICD-10-CM | POA: Diagnosis not present

## 2023-03-04 DIAGNOSIS — Z87440 Personal history of urinary (tract) infections: Secondary | ICD-10-CM

## 2023-03-04 DIAGNOSIS — N3001 Acute cystitis with hematuria: Secondary | ICD-10-CM

## 2023-03-04 LAB — MICROSCOPIC EXAMINATION: WBC, UA: >30 /[HPF] — AB (ref 0–5)

## 2023-03-04 LAB — URINALYSIS, ROUTINE W REFLEX MICROSCOPIC
Bilirubin, UA: NEGATIVE
Glucose, UA: NEGATIVE
Ketones, UA: NEGATIVE
Nitrite, UA: NEGATIVE
Specific Gravity, UA: 1.01 (ref 1.005–1.030)
Urobilinogen, Ur: 0.2 mg/dL (ref 0.2–1.0)
pH, UA: 7.5 (ref 5.0–7.5)

## 2023-03-04 MED ORDER — POTASSIUM CITRATE ER 15 MEQ (1620 MG) PO TBCR: 1.0000 | EXTENDED_RELEASE_TABLET | Freq: Two times a day (BID) | ORAL | 11 refills | Status: DC

## 2023-03-04 MED ORDER — INDAPAMIDE 2.5 MG PO TABS: 2.5000 mg | ORAL_TABLET | Freq: Every day | ORAL | 11 refills | Status: DC

## 2023-03-04 NOTE — Progress Notes (Signed)
03/04/2023 2:20 PM   Felicia Hogan Sep 11, 1945 [id number]  Referring provider: Alinda Deem, MD [address] [place],  [place] [address]  nephrolithiasis   HPI: Ms Mues is a 77yo here for followup for nephrolithiasis. She underwent parathyroidectomy 5 days ago. Renal US shows dust int he left kidney and a 1.1 cm right upper pole calculus. She denies any flank pain. She denies any worsening LUTS. No UTIs since last visit.    PMH: Past Medical History:  Diagnosis Date   Acute pyelonephritis 01/24/2018   Anxiety    Arthritis    Depression    Diabetes mellitus without complication (HCC)    Dyslipidemia    History of kidney stones    Hypertension    Iron deficiency anemia due to chronic blood loss    Mucous membrane pemphigoid 2014   so far only infects mouth. treated with oral steroids for flares. Lidex as well.    Sleep apnea     Surgical History: Past Surgical History:  Procedure Laterality Date   BIOPSY  11/17/2017   Procedure: BIOPSY;  Surgeon: West Bali, MD;  [facility];  Service: Endoscopy;;  gastric duodenum   CESAREAN SECTION     twice   CHOLECYSTECTOMY     COLONOSCOPY  10/2012   Dr. Aleene Davidson: normal. due 10/2017.    COLONOSCOPY WITH PROPOFOL N/A 11/17/2017   Procedure: COLONOSCOPY WITH PROPOFOL;  Surgeon: West Bali, MD;  [facility];  Service: Endoscopy;  Laterality: N/A;  8:45am   CYSTOSCOPY WITH RETROGRADE PYELOGRAM, URETEROSCOPY AND STENT PLACEMENT Left 12/04/2022   Procedure: CYSTOSCOPY WITH RETROGRADE PYELOGRAM, URETEROSCOPY AND STENT PLACEMENT;  Surgeon: Malen Gauze, MD;  [facility]: [facility];  Service: Urology;  Laterality: Left;   CYSTOSCOPY WITH RETROGRADE PYELOGRAM, URETEROSCOPY AND STENT PLACEMENT Left 01/22/2023   Procedure: CYSTOSCOPY WITH RETROGRADE PYELOGRAM, URETEROSCOPY AND STENT PLACEMENT;  Surgeon: Malen Gauze, MD;  [facility];  Service: Urology;  Laterality: Left;   ESOPHAGOGASTRODUODENOSCOPY  (EGD) WITH PROPOFOL N/A 11/17/2017   Procedure: ESOPHAGOGASTRODUODENOSCOPY (EGD) WITH PROPOFOL;  Surgeon: West Bali, MD;  [facility];  Service: Endoscopy;  Laterality: N/A;   GIVENS CAPSULE STUDY N/A 11/30/2017   Procedure: GIVENS CAPSULE STUDY;  Surgeon: West Bali, MD;  [facility];  Service: Endoscopy;  Laterality: N/A;  7:30am   HOLMIUM LASER APPLICATION Left 12/04/2022   Procedure: HOLMIUM LASER APPLICATION;  Surgeon: Malen Gauze, MD;  [facility];  Service: Urology;  Laterality: Left;   HOLMIUM LASER APPLICATION Left 01/22/2023   Procedure: HOLMIUM LASER APPLICATION;  Surgeon: Malen Gauze, MD;  [facility];  Service: Urology;  Laterality: Left;   LITHOTRIPSY  2017   POLYPECTOMY  11/17/2017   Procedure: POLYPECTOMY;  Surgeon: West Bali, MD;  [facility];  Service: Endoscopy;;  colon    REPLACEMENT TOTAL KNEE BILATERAL     TONSILLECTOMY     TOTAL HIP ARTHROPLASTY Right     Home Medications:  Allergies as of 03/04/2023       Reactions   Nsaids Itching, Rash   If given in large doses.    Sulfa Antibiotics Rash   Erythromycin Hives, Rash   Tamsulosin Itching, Rash   Tape Itching, Rash        Medication List        Accurate as of March 04, 2023  2:20 PM. If you have any questions, ask your nurse or doctor.  acetaminophen 650 MG CR tablet Commonly known as: TYLENOL Take 1,300 mg by mouth daily as needed for pain.   alfuzosin 10 MG 24 hr tablet Commonly known as: UROXATRAL Take 1 tablet (10 mg total) by mouth at bedtime.   BIOTIN PO Take 10,000 mcg by mouth 2 (two) times daily.   ciprofloxacin 250 MG tablet Commonly known as: Cipro Take 1 tablet (250 mg total) by mouth daily. Take daily until scheduled surgery on 10/31   COLLAGEN PO Take 1 Scoop by mouth daily.   desvenlafaxine 50 MG 24 hr tablet Commonly known as: PRISTIQ Take 50 mg by mouth daily.   estradiol 0.1 MG/GM  vaginal cream Commonly known as: ESTRACE Place 1 Applicatorful vaginally 2 (two) times a week.   Fish Oil 1200 MG Caps Take 1,200 mg by mouth 2 (two) times daily.   Gemtesa 75 MG Tabs Generic drug: Vibegron Take 75 mg by mouth daily.   indapamide 2.5 MG tablet Commonly known as: LOZOL Take 1 tablet by mouth once daily   oxyCODONE-acetaminophen 5-325 MG tablet Commonly known as: Percocet Take 1 tablet by mouth every 4 (four) hours as needed.   Potassium Citrate 15 MEQ (1620 MG) Tbcr Commonly known as: Urocit-K 15 Take 1 tablet by mouth 2 (two) times daily.   PRESERVISION AREDS PO Take 1 capsule by mouth 2 (two) times daily.   PROBIOTIC-PREBIOTIC PO Take 1 capsule by mouth daily. 31 billion CFU   tirzepatide 15 MG/0.5ML Pen Commonly known as: MOUNJARO Inject 15 mg into the skin every Sunday.   TURMERIC CURCUMIN PO Take 900 mg by mouth 2 (two) times daily.        Allergies:  Allergies  Allergen Reactions   Nsaids Itching and Rash    If given in large doses.     Sulfa Antibiotics Rash   Erythromycin Hives and Rash   Tamsulosin Itching and Rash   Tape Itching and Rash    Family History: Family History  Problem Relation Age of Onset   Liver cancer Mother        ?started in pancreas   Parkinson's disease Father    Pneumonia Father    Colon cancer Father 53       metastatic but did well   Thyroid cancer Father    Other Daughter 10       brain tumor, followed with serial MRIs. now age 70   Colon cancer Paternal Aunt 11    Social History:  reports that she quit smoking about 55 years ago. Her smoking use included cigarettes. She started smoking about 56 years ago. She has a 0.3 pack-year smoking history. She has never used smokeless tobacco. She reports current alcohol use. She reports that she does not use drugs.  ROS: All other review of systems were reviewed and are negative except what is noted above in HPI  Physical Exam: BP 118/68   Pulse (!) 101    Constitutional:  Alert and oriented, No acute distress. HEENT: [place] AT, moist mucus membranes.  Trachea midline, no masses. Cardiovascular: No clubbing, cyanosis, or edema. Respiratory: Normal respiratory effort, no increased work of breathing. GI: Abdomen is soft, nontender, nondistended, no abdominal masses GU: No CVA tenderness.  Lymph: No cervical or inguinal lymphadenopathy. Skin: No rashes, bruises or suspicious lesions. Neurologic: Grossly intact, no focal deficits, moving all 4 extremities. Psychiatric: Normal mood and affect.  Laboratory Data: Lab Results  Component Value Date   WBC 8.8 10/23/2022   HGB 14.6 10/23/2022  HCT 44.8 10/23/2022   MCV 86.8 10/23/2022   PLT 327 10/23/2022    Lab Results  Component Value Date   CREATININE 0.77 12/23/2022    No results found for: "PSA"  No results found for: "TESTOSTERONE"  No results found for: "HGBA1C"  Urinalysis    Component Value Date/Time   COLORURINE YELLOW 10/23/2022 1856   APPEARANCEUR Clear 01/28/2023 1336   LABSPEC 1.010 10/23/2022 1856   PHURINE 6.0 10/23/2022 1856   GLUCOSEU Negative 01/28/2023 1336   HGBUR MODERATE (A) 10/23/2022 1856   BILIRUBINUR Negative 01/28/2023 1336   KETONESUR NEGATIVE 10/23/2022 1856   PROTEINUR Negative 01/28/2023 1336   PROTEINUR 100 (A) 10/23/2022 1856   NITRITE Negative 01/28/2023 1336   NITRITE NEGATIVE 10/23/2022 1856   LEUKOCYTESUR 1+ (A) 01/28/2023 1336   LEUKOCYTESUR LARGE (A) 10/23/2022 1856    Lab Results  Component Value Date   LABMICR See below: 01/28/2023   WBCUA 6-10 (A) 01/28/2023   LABEPIT 0-10 01/28/2023   BACTERIA Few (A) 01/28/2023    Pertinent Imaging: Renal US 02/05/2023: Images reviewed and discussed with the patient  Results for orders placed during the hospital encounter of 10/22/22  Abdomen 1 view (KUB)  Narrative CLINICAL DATA:  Nephrolithiasis  EXAM: ABDOMEN - 1 VIEW  COMPARISON:  07/23/2022, 04/14/2022, CT  01/25/2018  FINDINGS: Right hip replacement with normal alignment. Nonobstructed gas pattern with moderate stool. Redemonstrated bilateral kidney stones. 11 mm stone projecting over upper pole of right kidney. Punctate stone projects over lower pole right kidney. Dominant 15 mm left kidney stone with suspected smaller 8 mm stone. Findings do not appear significantly changed.  IMPRESSION: Bilateral kidney stones, not significantly changed.   Electronically Signed By: Jasmine Pang M.D. On: 10/23/2022 19:17  No results found for this or any previous visit.  No results found for this or any previous visit.  No results found for this or any previous visit.  Results for orders placed during the hospital encounter of 02/05/23  Ultrasound renal complete  Narrative : PROCEDURE: US RENAL  HISTORY: Patient is a 77 y/o  F with nephrolithiasis.  COMPARISON: CT abdomen 12/17/2022, 12/10/2022.  TECHNIQUE: Two-dimensional grayscale and color Doppler ultrasound of the kidneys and bladder was performed.  FINDINGS: The bladder demonstrates a normal anechoic echogenicity without wall thickening. Prevoid bladder volume is 3.1 mL. The urinary bladder is empty postvoid. Bilateral ureteral jets are visualized.  The right kidney measures 13.0 cm in length. Renal cortical echotexture is normal. There is no hydronephrosis. There are multiple calculi, largest measuring 1.7 cm. There are no cysts.  The left kidney measures 13.4 cm in length. Renal cortical echotexture is normal. There is fullness of the upper calyx without hydronephrosis. This persists postvoid. There are multiple calculi, largest measuring 1.2 cm. There are no cysts.  IMPRESSION: 1. Multiple bilateral renal calculi.  Concordant with prior CT.  2.  Fullness of left renal upper pole calyx.  Thank you for allowing [place] to assist in the care of this patient.   Electronically Signed By: Lestine Box M.D. On: 02/16/2023  12:27  No valid procedures specified. No results found for this or any previous visit.  Results for orders placed in visit on 12/17/22  CT RENAL STONE STUDY  Narrative CLINICAL DATA:  Urinary tract calculi.  EXAM: CT ABDOMEN AND PELVIS WITHOUT CONTRAST  TECHNIQUE: Multidetector CT imaging of the abdomen and pelvis was performed following the standard protocol without IV contrast.  RADIATION DOSE REDUCTION: This exam was performed  according to the departmental dose-optimization program which includes automated exposure control, adjustment of the mA and/or kV according to patient size and/or use of iterative reconstruction technique.  COMPARISON:  01/24/2018 and abdomen radiograph from 10/22/2022  FINDINGS: Lower chest: Stable 5 by 3 mm subpleural nodule in the left lower lobe on image 19 series 4, stability over the last 5 years implies benign etiology. No further imaging workup of this lesion is indicated.  Mitral valve calcification.  Hepatobiliary: Cholecystectomy.  Otherwise unremarkable.  Pancreas: Unremarkable  Spleen: Unremarkable  Adrenals/Urinary Tract: 1.5 cm Bosniak category 1 cyst of the right kidney lower pole anteriorly on image 43 series 2. No further imaging workup of this lesion is indicated.  Obscuration of the right distal ureter and right side of the urinary bladder due to streak artifact from the patient's right hip implant.  Three nonobstructive right renal calculi are present, largest measures 1.1 cm in the upper pole.  Three nonobstructive left kidney lower pole calculi are observed with some staghorn characteristics, the largest is 1.6 cm in the lower pole.  Left double-J ureteral stent is present, no definite ureteral stone along the margin of the stent identified. Minimal caliectasis on the left without hydroureter. Stent positioning satisfactory.  Stomach/Bowel: Lax anterior abdominal wall with rectus diastasis, abdominal contents  overhang the proximal femurs through this laxity along the pannus. Air fluid levels in the sigmoid colon which could reflect diarrheal process. No dilated bowel.  Vascular/Lymphatic: Atherosclerosis is present, including aortoiliac atherosclerotic disease.  Reproductive: Unremarkable  Other: No supplemental non-categorized findings.  Musculoskeletal: Right total hip prosthesis. 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 Grade 1 degenerative retrolisthesis at L2-3 and grade 1 degenerative anterolisthesis at L4-5.  IMPRESSION: 1. Bilateral nonobstructive nephrolithiasis. 2. Left double-J ureteral stent in place, no definite ureteral stone along the margin of the stent identified. Minimal caliectasis on the left without hydroureter. 3. Air fluid levels in the sigmoid colon which could reflect diarrheal process. 4. Aortic atherosclerosis. 5. 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 6. Lax anterior abdominal wall with rectus diastasis, abdominal contents overhang the proximal femurs through this laxity along the pannus.  Aortic Atherosclerosis (ICD10-I70.0).   Electronically Signed By: Gaylyn Rong M.D. On: 12/17/2022 13:43   Assessment & Plan:    1. Renal stone Continue indapamide and UroCitK Followup 3 months with a KUB   No follow-ups on file.  Wilkie Aye, MD  [facility] [place]

## 2023-03-06 LAB — URINE CULTURE

## 2023-03-09 ENCOUNTER — Encounter: Payer: Self-pay | Admitting: Urology

## 2023-03-09 NOTE — Patient Instructions (Signed)

## 2023-03-17 MED ORDER — METOPROLOL TARTRATE 25 MG PO TABS
12.5000 mg | ORAL_TABLET | Freq: Two times a day (BID) | ORAL | 3 refills | Status: DC
Start: 2023-03-17 — End: 2023-04-28

## 2023-03-17 NOTE — Telephone Encounter (Signed)
Pt notified and verbalized understanding. Patient had no questions or concerns at this time. Will route message to scheduler to schedule 4-6w f/u with provider.

## 2023-04-01 ENCOUNTER — Other Ambulatory Visit: Payer: Self-pay | Admitting: Urology

## 2023-04-02 ENCOUNTER — Other Ambulatory Visit: Payer: Self-pay | Admitting: Urology

## 2023-04-02 ENCOUNTER — Other Ambulatory Visit: Payer: Medicare Other

## 2023-04-02 ENCOUNTER — Telehealth: Payer: Self-pay

## 2023-04-02 DIAGNOSIS — R300 Dysuria: Secondary | ICD-10-CM

## 2023-04-02 DIAGNOSIS — R8290 Unspecified abnormal findings in urine: Secondary | ICD-10-CM

## 2023-04-02 DIAGNOSIS — R399 Unspecified symptoms and signs involving the genitourinary system: Secondary | ICD-10-CM

## 2023-04-02 DIAGNOSIS — N200 Calculus of kidney: Secondary | ICD-10-CM | POA: Insufficient documentation

## 2023-04-02 MED ORDER — NITROFURANTOIN MONOHYD MACRO 100 MG PO CAPS: 100.0000 mg | ORAL_CAPSULE | Freq: Two times a day (BID) | ORAL | 0 refills | Status: DC

## 2023-04-02 NOTE — Telephone Encounter (Signed)
 Patient called and made aware.

## 2023-04-02 NOTE — Telephone Encounter (Addendum)
 Patient called complaining of urinary urgency, burning, odor, and cloudy urine. NP has no openings. Patient placed on lab schedule for urine drop off.

## 2023-04-02 NOTE — Telephone Encounter (Signed)
 Please see addended note

## 2023-04-03 LAB — URINALYSIS, ROUTINE W REFLEX MICROSCOPIC
Bilirubin, UA: NEGATIVE
Glucose, UA: NEGATIVE
Ketones, UA: NEGATIVE
Nitrite, UA: NEGATIVE
Specific Gravity, UA: 1.01 (ref 1.005–1.030)
Urobilinogen, Ur: 0.2 mg/dL (ref 0.2–1.0)
pH, UA: 6 (ref 5.0–7.5)

## 2023-04-03 LAB — MICROSCOPIC EXAMINATION: WBC, UA: >30 /[HPF] — AB (ref 0–5)

## 2023-04-06 ENCOUNTER — Telehealth: Payer: Self-pay

## 2023-04-06 ENCOUNTER — Other Ambulatory Visit: Payer: Self-pay | Admitting: Urology

## 2023-04-06 DIAGNOSIS — N3000 Acute cystitis without hematuria: Secondary | ICD-10-CM

## 2023-04-06 LAB — URINE CULTURE

## 2023-04-06 MED ORDER — CIPROFLOXACIN HCL 500 MG PO TABS: 500.0000 mg | ORAL_TABLET | Freq: Two times a day (BID) | ORAL | 0 refills | Status: AC

## 2023-04-06 NOTE — Telephone Encounter (Signed)
 Patient was made aware and voiced understanding.

## 2023-04-06 NOTE — Telephone Encounter (Signed)
-----   Message from Lauraine JAYSON Oz sent at 04/06/2023  9:02 AM EST ----- Please let pt know urine culture result. Pathogen is resistant to the Macrobid  (Nitrofurantoin ) which was prescribed empirically so that should be discontinued. Cipro  prescription sent today as alternative.

## 2023-04-28 ENCOUNTER — Encounter: Payer: Self-pay | Admitting: Internal Medicine

## 2023-04-28 ENCOUNTER — Ambulatory Visit: Payer: Medicare Other | Attending: Internal Medicine | Admitting: Internal Medicine

## 2023-04-28 VITALS — BP 118/78 | HR 69 | Ht 65.0 in | Wt 199.6 lb

## 2023-04-28 DIAGNOSIS — I471 Supraventricular tachycardia, unspecified: Secondary | ICD-10-CM | POA: Insufficient documentation

## 2023-04-28 DIAGNOSIS — R55 Syncope and collapse: Secondary | ICD-10-CM | POA: Insufficient documentation

## 2023-04-28 DIAGNOSIS — Z01810 Encounter for preprocedural cardiovascular examination: Secondary | ICD-10-CM

## 2023-04-28 MED ORDER — METOPROLOL TARTRATE 25 MG PO TABS: 25.0000 mg | ORAL_TABLET | Freq: Two times a day (BID) | ORAL | 3 refills | Status: DC

## 2023-04-28 NOTE — Progress Notes (Addendum)
 Cardiology Office Note  Date: 04/28/2023   ID: Felicia Hogan, DOB 30-May-1945, MRN [id number]  PCP:  Diedra Senior, MD  Cardiologist:  Diannah SHAUNNA Maywood, MD Electrophysiologist:  None   History of Present Illness: Felicia Hogan is a 78 y.o. female known to have DM 2, HLD is here for follow-up visit of syncope.  Patient had ER visit for syncope after which amlodipine  was discontinued with no recurrence of syncope since then.  2-week event monitor was performed that showed 6 runs of SVT, symptomatic.  Longest episode lasted for 16 minutes. Patient did report palpitations during that time.  She also reported having palpitations couple of times per month.  Lasting for a few minutes.  She was started on metoprolol  tartrate 12.5 mg twice daily, reported minimal improvement in palpitations last episode of palpitations was 1 week ago, lasted for few minutes as well.  No chest pain, SOB, syncope.  She does have near syncopal spells here and there.   Past Medical History:  Diagnosis Date   Acute pyelonephritis 01/24/2018   Anxiety    Arthritis    Depression    Diabetes mellitus without complication (HCC)    Dyslipidemia    History of kidney stones    Hypertension    Iron deficiency anemia due to chronic blood loss    Mucous membrane pemphigoid 2014   so far only infects mouth. treated with oral steroids for flares. Lidex as well.    Sleep apnea     Past Surgical History:  Procedure Laterality Date   BIOPSY  11/17/2017   Procedure: BIOPSY;  Surgeon: Harvey Margo CROME, MD;  [facility];  Service: Endoscopy;;  gastric duodenum   CESAREAN SECTION     twice   CHOLECYSTECTOMY     COLONOSCOPY  10/2012   Dr. Rhoda: normal. due 10/2017.    COLONOSCOPY WITH PROPOFOL  N/A 11/17/2017   Procedure: COLONOSCOPY WITH PROPOFOL ;  Surgeon: Harvey Margo CROME, MD;  [facility];  Service: Endoscopy;  Laterality: N/A;  8:45am   CYSTOSCOPY WITH RETROGRADE PYELOGRAM, URETEROSCOPY  AND STENT PLACEMENT Left 12/04/2022   Procedure: CYSTOSCOPY WITH RETROGRADE PYELOGRAM, URETEROSCOPY AND STENT PLACEMENT;  Surgeon: Sherrilee Belvie CROME, MD;  [facility];  Service: Urology;  Laterality: Left;   CYSTOSCOPY WITH RETROGRADE PYELOGRAM, URETEROSCOPY AND STENT PLACEMENT Left 01/22/2023   Procedure: CYSTOSCOPY WITH RETROGRADE PYELOGRAM, URETEROSCOPY AND STENT PLACEMENT;  Surgeon: Sherrilee Belvie CROME, MD;  [facility];  Service: Urology;  Laterality: Left;   ESOPHAGOGASTRODUODENOSCOPY (EGD) WITH PROPOFOL  N/A 11/17/2017   Procedure: ESOPHAGOGASTRODUODENOSCOPY (EGD) WITH PROPOFOL ;  Surgeon: Harvey Margo CROME, MD;  [facility];  Service: Endoscopy;  Laterality: N/A;   GIVENS CAPSULE STUDY N/A 11/30/2017   Procedure: GIVENS CAPSULE STUDY;  Surgeon: Harvey Margo CROME, MD;  [facility];  Service: Endoscopy;  Laterality: N/A;  7:30am   HOLMIUM LASER APPLICATION Left 12/04/2022   Procedure: HOLMIUM LASER APPLICATION;  Surgeon: Sherrilee Belvie CROME, MD;  [facility];  Service: Urology;  Laterality: Left;   HOLMIUM LASER APPLICATION Left 01/22/2023   Procedure: HOLMIUM LASER APPLICATION;  Surgeon: Sherrilee Belvie CROME, MD;  [facility]: [facility];  Service: Urology;  Laterality: Left;   LITHOTRIPSY  2017   POLYPECTOMY  11/17/2017   Procedure: POLYPECTOMY;  Surgeon: Harvey Margo CROME, MD;  [facility];  Service: Endoscopy;;  colon    REPLACEMENT TOTAL KNEE BILATERAL     TONSILLECTOMY     TOTAL HIP ARTHROPLASTY Right  Current Outpatient Medications  Medication Sig Dispense Refill   acetaminophen  (TYLENOL ) 650 MG CR tablet Take 1,300 mg by mouth daily as needed for pain.     alfuzosin  (UROXATRAL ) 10 MG 24 hr tablet TAKE 1 TABLET BY MOUTH AT BEDTIME 30 tablet 0   Bacillus Coagulans-Inulin (PROBIOTIC-PREBIOTIC PO) Take 1 capsule by mouth daily. 31 billion CFU     BIOTIN PO Take 10,000 mcg by mouth 2 (two) times daily.     COLLAGEN PO Take 1 Scoop by mouth daily.      desvenlafaxine (PRISTIQ) 50 MG 24 hr tablet Take 50 mg by mouth daily.     estradiol (ESTRACE) 0.1 MG/GM vaginal cream Place 1 Applicatorful vaginally 2 (two) times a week.     GEMTESA  75 MG TABS Take 75 mg by mouth daily.     indapamide  (LOZOL ) 2.5 MG tablet Take 1 tablet (2.5 mg total) by mouth daily. 30 tablet 11   metoprolol  tartrate (LOPRESSOR ) 25 MG tablet Take 0.5 tablets (12.5 mg total) by mouth 2 (two) times daily. 90 tablet 3   Multiple Vitamins-Minerals (PRESERVISION AREDS PO) Take 1 capsule by mouth 2 (two) times daily.      Omega-3 Fatty Acids (FISH OIL) 1200 MG CAPS Take 1,200 mg by mouth 2 (two) times daily.     oxyCODONE -acetaminophen  (PERCOCET) 5-325 MG tablet Take 1 tablet by mouth every 4 (four) hours as needed. 30 tablet 0   Potassium Citrate  (UROCIT-K  15) 15 MEQ (1620 MG) TBCR Take 1 tablet by mouth 2 (two) times daily. 60 tablet 11   tirzepatide (MOUNJARO) 15 MG/0.5ML Pen Inject 15 mg into the skin every Sunday.     TURMERIC CURCUMIN PO Take 900 mg by mouth 2 (two) times daily.     No current facility-administered medications for this visit.   Allergies:  Nsaids, Sulfa antibiotics, Erythromycin, Tamsulosin, and Tape   Social History: The patient  reports that she quit smoking about 55 years ago. Her smoking use included cigarettes. She started smoking about 56 years ago. She has a 0.3 pack-year smoking history. She has never used smokeless tobacco. She reports current alcohol use. She reports that she does not use drugs.   Family History: The patient's family history includes Colon cancer (age of onset: 7) in her father; Colon cancer (age of onset: 49) in her paternal aunt; Liver cancer in her mother; Other (age of onset: 28) in her daughter; Parkinson's disease in her father; Pneumonia in her father; Thyroid cancer in her father.   ROS:  Please see the history of present illness. Otherwise, complete review of systems is positive for none.  All other systems are  reviewed and negative.   Physical Exam: VS:  There were no vitals taken for this visit., BMI There is no height or weight on file to calculate BMI.  Wt Readings from Last 3 Encounters:  01/26/23 196 lb 3.2 oz (89 kg)  01/22/23 182 lb 8 oz (82.8 kg)  01/19/23 185 lb (83.9 kg)    General: Patient appears comfortable at rest. HEENT: Conjunctiva and lids normal, oropharynx clear with moist mucosa. Neck: Supple, no elevated JVP or carotid bruits, no thyromegaly. Lungs: Clear to auscultation, nonlabored breathing at rest. Cardiac: Regular rate and rhythm, no S3 or significant systolic murmur, no pericardial rub. Abdomen: Soft, nontender, no hepatomegaly, bowel sounds present, no guarding or rebound. Extremities: No pitting edema, distal pulses 2+. Skin: Warm and dry. Musculoskeletal: No kyphosis. Neuropsychiatric: Alert and oriented x3, affect grossly appropriate.  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  No recurrence since then.  2-week event monitor was performed that showed 6 runs of SVT, symptomatic (171 to 107 bpm).  1.7% PAC burden and less than 1% PVC burden.  Currently on metoprolol  tartrate 12.5 mg twice daily, continues to have palpitations, will increase the dose from 12.5 mg to 25 mg twice daily.  Echocardiogram in 2024 showed normal LVEF, severe basal septal hypertrophy and no valvular heart disease.  Addendum on 05/20/2023 Preop cardiac risk stratification for cystoscopy and urethroscopy with stent placement: Patient underwent  partial par[facility]roidectomy at [facility] in 02/2023, tolerated procedure well and had no post-op complications. She had no symptoms except for palpitations. Low risk for any perioperative cardiac complications. Maintain adequate hydration due to severe basal septal hypertrophy on Echo.  Disposition:  Follow up 1 year  Signed, Dulse Rutan Arleta Maywood, MD, 04/28/2023 8:56 AM    [facility] at [facility] [address], [place], [place] [address]

## 2023-04-28 NOTE — Patient Instructions (Signed)
 Medication Instructions:  Your physician has recommended you make the following change in your medication:  Increase Metoprolol  from 12.5 mg to 25 mg twice daily  Continue taking all other medications as precribed  Labwork: None  Testing/Procedures: None  Follow-Up: Your physician recommends that you schedule a follow-up appointment in: 1 year. You will receive a reminder call in about 8 months reminding you to schedule your appointment. If you don't receive this call, please contact our office.   Any Other Special Instructions Will Be Listed Below (If Applicable). Thank you for choosing [facility]!      If you need a refill on your cardiac medications before your next appointment, please call your pharmacy.

## 2023-05-07 ENCOUNTER — Other Ambulatory Visit: Payer: Self-pay | Admitting: Urology

## 2023-05-18 ENCOUNTER — Ambulatory Visit: Payer: Medicare Other | Admitting: Urology

## 2023-05-18 ENCOUNTER — Ambulatory Visit (HOSPITAL_COMMUNITY)
Admission: RE | Admit: 2023-05-18 | Discharge: 2023-05-18 | Disposition: A | Discharge status: Home / Self Care | Payer: Medicare Other | Source: Ambulatory Visit | Attending: Urology | Admitting: Urology

## 2023-05-18 VITALS — BP 105/50 | HR 80

## 2023-05-18 DIAGNOSIS — N200 Calculus of kidney: Secondary | ICD-10-CM

## 2023-05-18 DIAGNOSIS — R82998 Other abnormal findings in urine: Secondary | ICD-10-CM | POA: Diagnosis not present

## 2023-05-18 DIAGNOSIS — N3001 Acute cystitis with hematuria: Secondary | ICD-10-CM

## 2023-05-18 LAB — URINALYSIS, ROUTINE W REFLEX MICROSCOPIC
Bilirubin, UA: NEGATIVE
Glucose, UA: NEGATIVE
Ketones, UA: NEGATIVE
Nitrite, UA: NEGATIVE
Specific Gravity, UA: 1.015 (ref 1.005–1.030)
Urobilinogen, Ur: 0.2 mg/dL (ref 0.2–1.0)
pH, UA: 6.5 (ref 5.0–7.5)

## 2023-05-18 LAB — MICROSCOPIC EXAMINATION: WBC, UA: >30 /[HPF] — AB (ref 0–5)

## 2023-05-18 MED ORDER — DOXYCYCLINE HYCLATE 100 MG PO CAPS: 100.0000 mg | ORAL_CAPSULE | Freq: Two times a day (BID) | ORAL | 0 refills | Status: DC

## 2023-05-18 MED ORDER — POTASSIUM CITRATE ER 15 MEQ (1620 MG) PO TBCR: 1.0000 | EXTENDED_RELEASE_TABLET | Freq: Two times a day (BID) | ORAL | 11 refills | Status: DC

## 2023-05-18 NOTE — Progress Notes (Unsigned)
 05/18/2023 10:49 AM   Felicia Hogan [birth year] [id number]  Referring provider: Alinda Deem, MD [address] [place],  [place] [address]  No chief complaint on file.   HPI: KUB shows right upper pole calculus and no definitive left renal calculi. She was treated with cipro for a UTI on 1/9. Ionized calcium 5.1, PTH 31. NO stone events since last visit. No flank pain. She is having new urinary urgency and frequency. UA is concerning for infection. She is on potassium citrate BID and indapamide 2.5mg  daily    PMH: Past Medical History:  Diagnosis Date   Acute pyelonephritis 01/24/2018   Anxiety    Arthritis    Depression    Diabetes mellitus without complication (HCC)    Dyslipidemia    History of kidney stones    Hypertension    Iron deficiency anemia due to chronic blood loss    Mucous membrane pemphigoid 2014   so far only infects mouth. treated with oral steroids for flares. Lidex as well.    Sleep apnea     Surgical History: Past Surgical History:  Procedure Laterality Date   BIOPSY  11/17/2017   Procedure: BIOPSY;  Surgeon: West Bali, MD;  [facility]: [facility];  Service: Endoscopy;;  gastric duodenum   CESAREAN SECTION     twice   CHOLECYSTECTOMY     COLONOSCOPY  10/2012   Dr. Aleene Davidson: normal. due 10/2017.    COLONOSCOPY WITH PROPOFOL N/A 11/17/2017   Procedure: COLONOSCOPY WITH PROPOFOL;  Surgeon: West Bali, MD;  [facility]: [facility];  Service: Endoscopy;  Laterality: N/A;  8:45am   CYSTOSCOPY WITH RETROGRADE PYELOGRAM, URETEROSCOPY AND STENT PLACEMENT Left 12/04/2022   Procedure: CYSTOSCOPY WITH RETROGRADE PYELOGRAM, URETEROSCOPY AND STENT PLACEMENT;  Surgeon: Malen Gauze, MD;  Location: [facility];  Service: Urology;  Laterality: Left;   CYSTOSCOPY WITH RETROGRADE PYELOGRAM, URETEROSCOPY AND STENT PLACEMENT Left 01/22/2023   Procedure: CYSTOSCOPY WITH RETROGRADE PYELOGRAM, URETEROSCOPY AND STENT PLACEMENT;  Surgeon: Malen Gauze, MD;  Location: [facility];  Service: Urology;  Laterality: Left;   ESOPHAGOGASTRODUODENOSCOPY (EGD) WITH PROPOFOL N/A 11/17/2017   Procedure: ESOPHAGOGASTRODUODENOSCOPY (EGD) WITH PROPOFOL;  Surgeon: West Bali, MD;  [facility];  Service: Endoscopy;  Laterality: N/A;   GIVENS CAPSULE STUDY N/A 11/30/2017   Procedure: GIVENS CAPSULE STUDY;  Surgeon: West Bali, MD;  [facility]: [facility];  Service: Endoscopy;  Laterality: N/A;  7:30am   HOLMIUM LASER APPLICATION Left 12/04/2022   Procedure: HOLMIUM LASER APPLICATION;  Surgeon: Malen Gauze, MD;  Location: [facility];  Service: Urology;  Laterality: Left;   HOLMIUM LASER APPLICATION Left 01/22/2023   Procedure: HOLMIUM LASER APPLICATION;  Surgeon: Malen Gauze, MD;  Location: [facility];  Service: Urology;  Laterality: Left;   LITHOTRIPSY  2017   POLYPECTOMY  11/17/2017   Procedure: POLYPECTOMY;  Surgeon: West Bali, MD;  [facility]: [facility];  Service: Endoscopy;;  colon    REPLACEMENT TOTAL KNEE BILATERAL     TONSILLECTOMY     TOTAL HIP ARTHROPLASTY Right     Home Medications:  Allergies as of 05/18/2023       Reactions   Nsaids Itching, Rash   If given in large doses.    Sulfa Antibiotics Rash   Erythromycin Hives, Rash   Tamsulosin Itching, Rash   Tape Itching, Rash        Medication List        Accurate as of May 18, 2023 10:49 AM. If you have any questions, ask your nurse or doctor.          acetaminophen 650 MG CR tablet Commonly known as: TYLENOL Take 1,300 mg by mouth daily as needed for pain.   alfuzosin 10 MG 24 hr tablet Commonly known as: UROXATRAL TAKE 1 TABLET BY MOUTH AT BEDTIME   BIOTIN PO Take 10,000 mcg by mouth 2 (two) times daily.   COLLAGEN PO Take 1 Scoop by mouth daily.   desvenlafaxine 50 MG 24 hr tablet Commonly known as: PRISTIQ Take 50 mg by mouth daily.   estradiol 0.1 MG/GM vaginal cream Commonly known as: ESTRACE Place 1 Applicatorful  vaginally 2 (two) times a week.   Fish Oil 1200 MG Caps Take 1,200 mg by mouth 2 (two) times daily.   indapamide 2.5 MG tablet Commonly known as: LOZOL Take 1 tablet (2.5 mg total) by mouth daily.   metoprolol tartrate 25 MG tablet Commonly known as: LOPRESSOR Take 1 tablet (25 mg total) by mouth 2 (two) times daily.   Potassium Citrate 15 MEQ (1620 MG) Tbcr Commonly known as: Urocit-K 15 Take 1 tablet by mouth 2 (two) times daily.   PRESERVISION AREDS PO Take 1 capsule by mouth 2 (two) times daily.   PROBIOTIC-PREBIOTIC PO Take 1 capsule by mouth daily. 31 billion CFU   tirzepatide 15 MG/0.5ML Pen Commonly known as: MOUNJARO Inject 15 mg into the skin every Sunday.   TURMERIC CURCUMIN PO Take 900 mg by mouth 2 (two) times daily.        Allergies:  Allergies  Allergen Reactions   Nsaids Itching and Rash    If given in large doses.     Sulfa Antibiotics Rash   Erythromycin Hives and Rash   Tamsulosin Itching and Rash   Tape Itching and Rash    Family History: Family History  Problem Relation Age of Onset   Liver cancer Mother        ?started in pancreas   Parkinson's disease Father    Pneumonia Father    Colon cancer Father 6       metastatic but did well   Thyroid cancer Father    Other Daughter 10       brain tumor, followed with serial MRIs. now age 38   Colon cancer Paternal Aunt 21    Social History:  reports that she quit smoking about 55 years ago. Her smoking use included cigarettes. She started smoking about 56 years ago. She has a 0.3 pack-year smoking history. She has never used smokeless tobacco. She reports current alcohol use. She reports that she does not use drugs.  ROS: All other review of systems were reviewed and are negative except what is noted above in HPI  Physical Exam: BP (!) 105/50   Pulse 80   Constitutional:  Alert and oriented, No acute distress. HEENT: [place] AT, moist mucus membranes.  Trachea midline, no  masses. Cardiovascular: No clubbing, cyanosis, or edema. Respiratory: Normal respiratory effort, no increased work of breathing. GI: Abdomen is soft, nontender, nondistended, no abdominal masses GU: No CVA tenderness.  Lymph: No cervical or inguinal lymphadenopathy. Skin: No rashes, bruises or suspicious lesions. Neurologic: Grossly intact, no focal deficits, moving all 4 extremities. Psychiatric: Normal mood and affect.  Laboratory Data: Lab Results  Component Value Date   WBC 8.8 10/23/2022   HGB 14.6 10/23/2022   HCT 44.8 10/23/2022   MCV 86.8 10/23/2022   PLT 327 10/23/2022  Lab Results  Component Value Date   CREATININE 0.77 12/23/2022    No results found for: "PSA"  No results found for: "TESTOSTERONE"  No results found for: "HGBA1C"  Urinalysis    Component Value Date/Time   COLORURINE YELLOW 10/23/2022 1856   APPEARANCEUR Cloudy (A) 04/02/2023 1527   LABSPEC 1.010 10/23/2022 1856   PHURINE 6.0 10/23/2022 1856   GLUCOSEU Negative 04/02/2023 1527   HGBUR MODERATE (A) 10/23/2022 1856   BILIRUBINUR Negative 04/02/2023 1527   KETONESUR NEGATIVE 10/23/2022 1856   PROTEINUR Trace 04/02/2023 1527   PROTEINUR 100 (A) 10/23/2022 1856   NITRITE Negative 04/02/2023 1527   NITRITE NEGATIVE 10/23/2022 1856   LEUKOCYTESUR 3+ (A) 04/02/2023 1527   LEUKOCYTESUR LARGE (A) 10/23/2022 1856    Lab Results  Component Value Date   LABMICR See below: 04/02/2023   WBCUA >30 (A) 04/02/2023   LABEPIT 0-10 04/02/2023   BACTERIA Few (A) 04/02/2023    Pertinent Imaging: KUB today: Images reviewed and discussed with the patient  Results for orders placed during the hospital encounter of 10/22/22  Abdomen 1 view (KUB)  Narrative CLINICAL DATA:  Nephrolithiasis  EXAM: ABDOMEN - 1 VIEW  COMPARISON:  07/23/2022, 04/14/2022, CT 01/25/2018  FINDINGS: Right hip replacement with normal alignment. Nonobstructed gas pattern with moderate stool. Redemonstrated bilateral  kidney stones. 11 mm stone projecting over upper pole of right kidney. Punctate stone projects over lower pole right kidney. Dominant 15 mm left kidney stone with suspected smaller 8 mm stone. Findings do not appear significantly changed.  IMPRESSION: Bilateral kidney stones, not significantly changed.   Electronically Signed By: Jasmine Pang M.D. On: 10/23/2022 19:17  No results found for this or any previous visit.  No results found for this or any previous visit.  No results found for this or any previous visit.  Results for orders placed during the hospital encounter of 02/05/23  Ultrasound renal complete  Narrative : PROCEDURE: US RENAL  HISTORY: Patient is a 78 y/o  F with nephrolithiasis.  COMPARISON: CT abdomen 12/17/2022, 12/10/2022.  TECHNIQUE: Two-dimensional grayscale and color Doppler ultrasound of the kidneys and bladder was performed.  FINDINGS: The bladder demonstrates a normal anechoic echogenicity without wall thickening. Prevoid bladder volume is 3.1 mL. The urinary bladder is empty postvoid. Bilateral ureteral jets are visualized.  The right kidney measures 13.0 cm in length. Renal cortical echotexture is normal. There is no hydronephrosis. There are multiple calculi, largest measuring 1.7 cm. There are no cysts.  The left kidney measures 13.4 cm in length. Renal cortical echotexture is normal. There is fullness of the upper calyx without hydronephrosis. This persists postvoid. There are multiple calculi, largest measuring 1.2 cm. There are no cysts.  IMPRESSION: 1. Multiple bilateral renal calculi.  Concordant with prior CT.  2.  Fullness of left renal upper pole calyx.  Thank you for allowing [place] to assist in the care of this patient.   Electronically Signed By: Lestine Box M.D. On: 02/16/2023 12:27  No results found for this or any previous visit.  No results found for this or any previous visit.  Results for orders placed in  visit on 12/17/22  CT RENAL STONE STUDY  Narrative CLINICAL DATA:  Urinary tract calculi.  EXAM: CT ABDOMEN AND PELVIS WITHOUT CONTRAST  TECHNIQUE: Multidetector CT imaging of the abdomen and pelvis was performed following the standard protocol without IV contrast.  RADIATION DOSE REDUCTION: This exam was performed according to the departmental dose-optimization program which includes automated exposure  control, adjustment of the mA and/or kV according to patient size and/or use of iterative reconstruction technique.  COMPARISON:  01/24/2018 and abdomen radiograph from 10/22/2022  FINDINGS: Lower chest: Stable 5 by 3 mm subpleural nodule in the left lower lobe on image 19 series 4, stability over the last 5 years implies benign etiology. No further imaging workup of this lesion is indicated.  Mitral valve calcification.  Hepatobiliary: Cholecystectomy.  Otherwise unremarkable.  Pancreas: Unremarkable  Spleen: Unremarkable  Adrenals/Urinary Tract: 1.5 cm Bosniak category 1 cyst of the right kidney lower pole anteriorly on image 43 series 2. No further imaging workup of this lesion is indicated.  Obscuration of the right distal ureter and right side of the urinary bladder due to streak artifact from the patient's right hip implant.  Three nonobstructive right renal calculi are present, largest measures 1.1 cm in the upper pole.  Three nonobstructive left kidney lower pole calculi are observed with some staghorn characteristics, the largest is 1.6 cm in the lower pole.  Left double-J ureteral stent is present, no definite ureteral stone along the margin of the stent identified. Minimal caliectasis on the left without hydroureter. Stent positioning satisfactory.  Stomach/Bowel: Lax anterior abdominal wall with rectus diastasis, abdominal contents overhang the proximal femurs through this laxity along the pannus. Air fluid levels in the sigmoid colon which  could reflect diarrheal process. No dilated bowel.  Vascular/Lymphatic: Atherosclerosis is present, including aortoiliac atherosclerotic disease.  Reproductive: Unremarkable  Other: No supplemental non-categorized findings.  Musculoskeletal: Right total hip prosthesis. 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 Grade 1 degenerative retrolisthesis at L2-3 and grade 1 degenerative anterolisthesis at L4-5.  IMPRESSION: 1. Bilateral nonobstructive nephrolithiasis. 2. Left double-J ureteral stent in place, no definite ureteral stone along the margin of the stent identified. Minimal caliectasis on the left without hydroureter. 3. Air fluid levels in the sigmoid colon which could reflect diarrheal process. 4. Aortic atherosclerosis. 5. 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 6. Lax anterior abdominal wall with rectus diastasis, abdominal contents overhang the proximal femurs through this laxity along the pannus.  Aortic Atherosclerosis (ICD10-I70.0).   Electronically Signed By: Gaylyn Rong M.D. On: 12/17/2022 13:43   Assessment & Plan:    1. Renal stone (Primary) -continue indapamide  -continue urocit K BID -stop uroxatral - Urinalysis, Routine w reflex microscopic  2. Acute cystitis with hematuria Urine culture Doxycycline 100mg  BID for 7 days   No follow-ups on file.  Wilkie Aye, MD  [facility] [place]

## 2023-05-19 ENCOUNTER — Telehealth: Payer: Self-pay | Admitting: *Deleted

## 2023-05-19 NOTE — Telephone Encounter (Signed)
   Pre-operative Risk Assessment    Patient Name: Felicia Hogan  DOB: May 09, 1945 MRN: [id number]   Date of last office visit: 04/28/23 DR. MALLIPEDDI Date of next office visit: NONE   Request for Surgical Clearance    Procedure:   CYSTOSCOPY WITH URETEROSCOPY AND STENT PLACEMENT   Date of Surgery:  Clearance 06/04/23                                Surgeon:  DR. Ronne Binning Surgeon's Group or Practice Name:  [facility] Phone number:  [phone] Fax number:  [phone]   Type of Clearance Requested:   - Medical ; NONE INDICATED ON FORM TO BE HELD   Type of Anesthesia:  General    Additional requests/questions:    Elpidio Anis   05/19/2023, 5:41 PM

## 2023-05-20 DIAGNOSIS — Z01810 Encounter for preprocedural cardiovascular examination: Secondary | ICD-10-CM | POA: Insufficient documentation

## 2023-05-21 ENCOUNTER — Encounter: Payer: Self-pay | Admitting: Urology

## 2023-05-21 NOTE — Patient Instructions (Signed)

## 2023-05-22 LAB — URINE CULTURE

## 2023-05-29 NOTE — Patient Instructions (Signed)
 Felicia Hogan  05/29/2023     @PREFPERIOPPHARMACY @   Your procedure is scheduled on  06/04/2023.   Report to [facility] at  0730  A.M.   Call this number if you have problems the morning of surgery:  [phone]  If you experience any cold or flu symptoms such as cough, fever, chills, shortness of breath, etc. between now and your scheduled surgery, please notify us at the above number.   Remember:         Your last dose of mounjaro should have been on 05/27/2023.       DO NOT take any medications for diabetes the mornig of your procedure.   Do not eat after midnight.   You may drink clear liquids until 0530 am on 06/04/2023.  Clear liquids allowed are:                    Water, Juice (No red color; non-citric and without pulp; diabetics please choose diet or no sugar options), Carbonated beverages (diabetics please choose diet or no sugar options), Clear Tea (No creamer, milk, or cream, including half & half and powdered creamer), Black Coffee Only (No creamer, milk or cream, including half & half and powdered creamer), and Clear Sports drink (No red color; diabetics please choose diet or no sugar options)    Take these medicines the morning of surgery with A SIP OF WATER                           pristiq, metoprolol.    Do not wear jewelry, make-up or nail polish, including gel polish,  artificial nails, or any other type of covering on natural nails (fingers and  toes).  Do not wear lotions, powders, or perfumes, or deodorant.  Do not shave 48 hours prior to surgery.  Men may shave face and neck.  Do not bring valuables to the hospital.  [facility] is not responsible for any belongings or valuables.  Contacts, dentures or bridgework may not be worn into surgery.  Leave your suitcase in the car.  After surgery it may be brought to your room.  For patients admitted to the hospital, discharge time will be determined by your treatment team.  Patients discharged  the day of surgery will not be allowed to drive home and must have someone with them for 24 hours.    Special instructions:   DO NOT smoke tobacco or vape for 24 hours before your procedure.  Please read over the following fact sheets that you were given. Coughing and Deep Breathing, Anesthesia Post-op Instructions, and Care and Recovery After Surgery      Ureteral Stent Implantation, Care After The following information offers guidance on how to care for yourself after your procedure. Your health care provider may also give you more specific instructions. If you have problems or questions, contact your health care provider. What can I expect after the procedure? After the procedure, it is common to have: Nausea. Mild pain when you urinate. You may feel this pain in your lower back or lower abdomen. The pain should stop within a few minutes after you urinate. This pattern may last for up to 1 week. A small amount of blood in your urine for several days. Follow these instructions at home: Medicines Take over-the-counter and prescription medicines only as told by your health care provider. If you were prescribed antibiotics, take  them as told by your health care provider. Do not stop using the antibiotic even if you start to feel better. If you were given a sedative during the procedure, it can affect you for several hours. Do not drive or operate machinery until your health care provider says that it is safe. Ask your health care provider if the medicine prescribed to you: Requires you to avoid driving or using machinery. Can cause constipation. You may need to take these actions to prevent or treat constipation: Take over-the-counter or prescription medicines. Eat foods that are high in fiber, such as beans, whole grains, and fresh fruits and vegetables. Limit foods that are high in fat and processed sugars, such as fried or sweet foods. Activity Rest as told by your health care  provider. Do not sit for a long time without moving. Get up to take short walks every 1-2 hours. This will improve blood flow and breathing. Ask for help if you feel weak or unsteady. Return to your normal activities as told by your health care provider. Ask your health care provider what activities are safe for you. General instructions  If you have a catheter: Follow instructions from your health care provider about taking care of your catheter and collection bag. Do not take baths, swim, or use a hot tub until your health care provider approves. Ask your health care provider if you may take showers. You may only be allowed to take sponge baths. Drink enough fluid to keep your urine pale yellow. Do not use any products that contain nicotine or tobacco. These products include cigarettes, chewing tobacco, and vaping devices, such as e-cigarettes. These can delay healing after surgery. If you need help quitting, ask your health care provider. Keep all follow-up visits. Contact a health care provider if: You start passing blood clots, or you have more than a small amount of blood in your urine. You have pain that gets worse or does not get better with medicine, especially pain when you urinate. You have trouble urinating. You feel nauseous or you vomit again and again during a period of more than 2 days after the procedure. You have a fever. Get help right away if: You are passing blood clots that are 1 inch (2.5 cm) or larger in size. You are leaking urine (have incontinence), or you cannot urinate. The end of the stent comes out of your urethra. You have sudden, sharp, or severe pain in your abdomen or lower back. You have swelling or pain in your legs. You have trouble breathing. These symptoms may be an emergency. Get help right away. Call 911. Do not wait to see if the symptoms will go away. Do not drive yourself to the hospital. Summary After the procedure, it is common to have mild  pain when you urinate that goes away within a few minutes after you urinate. This may last for up to 1 week. Take over-the-counter and prescription medicines only as told by your health care provider. Drink enough fluid to keep your urine pale yellow. Call your health care provider if you start passing blood clots, or you have more than a small amount of blood in your urine. This information is not intended to replace advice given to you by your health care provider. Make sure you discuss any questions you have with your health care provider. Document Revised: 04/15/2021 Document Reviewed: 04/15/2021 Elsevier Patient Education  2024 [employer]General Anesthesia, Adult, Care After The following information offers guidance on how to  care for yourself after your procedure. Your health care provider may also give you more specific instructions. If you have problems or questions, contact your health care provider. What can I expect after the procedure? After the procedure, it is common for people to: Have pain or discomfort at the IV site. Have nausea or vomiting. Have a sore throat or hoarseness. Have trouble concentrating. Feel cold or chills. Feel weak, sleepy, or tired (fatigue). Have soreness and body aches. These can affect parts of the body that were not involved in surgery. Follow these instructions at home: For the time period you were told by your health care provider:  Rest. Do not participate in activities where you could fall or become injured. Do not drive or use machinery. Do not drink alcohol. Do not take sleeping pills or medicines that cause drowsiness. Do not make important decisions or sign legal documents. Do not take care of children on your own. General instructions Drink enough fluid to keep your urine pale yellow. If you have sleep apnea, surgery and certain medicines can increase your risk for breathing problems. Follow instructions from your health care provider  about wearing your sleep device: Anytime you are sleeping, including during daytime naps. While taking prescription pain medicines, sleeping medicines, or medicines that make you drowsy. Return to your normal activities as told by your health care provider. Ask your health care provider what activities are safe for you. Take over-the-counter and prescription medicines only as told by your health care provider. Do not use any products that contain nicotine or tobacco. These products include cigarettes, chewing tobacco, and vaping devices, such as e-cigarettes. These can delay incision healing after surgery. If you need help quitting, ask your health care provider. Contact a health care provider if: You have nausea or vomiting that does not get better with medicine. You vomit every time you eat or drink. You have pain that does not get better with medicine. You cannot urinate or have bloody urine. You develop a skin rash. You have a fever. Get help right away if: You have trouble breathing. You have chest pain. You vomit blood. These symptoms may be an emergency. Get help right away. Call 911. Do not wait to see if the symptoms will go away. Do not drive yourself to the hospital. Summary After the procedure, it is common to have a sore throat, hoarseness, nausea, vomiting, or to feel weak, sleepy, or fatigue. For the time period you were told by your health care provider, do not drive or use machinery. Get help right away if you have difficulty breathing, have chest pain, or vomit blood. These symptoms may be an emergency. This information is not intended to replace advice given to you by your health care provider. Make sure you discuss any questions you have with your health care provider. Document Revised: 06/07/2021 Document Reviewed: 06/07/2021 Elsevier Patient Education  2024 [employer].How to Use Chlorhexidine at Home in the Shower Chlorhexidine gluconate (CHG) is a germ-killing  (antiseptic) wash that's used to clean the skin. It can get rid of the germs that normally live on the skin and can keep them away for about 24 hours. If you're having surgery, you may be told to shower with CHG at home the night before surgery. This can help lower your risk for infection. To use CHG wash in the shower, follow the steps below. Supplies needed: CHG body wash. Clean washcloth. Clean towel. How to use CHG in the shower Follow these steps  unless you're told to use CHG in a different way: Start the shower. Use your normal soap and shampoo to wash your face and hair. Turn off the shower or move out of the shower stream. Pour CHG onto a clean washcloth. Do not use any type of brush or rough sponge. Start at your neck, washing your body down to your toes. Make sure you: Wash the part of your body where the surgery will be done for at least 1 minute. Do not scrub. Do not use CHG on your head or face unless your health care provider tells you to. If it gets into your ears or eyes, rinse them well with water. Do not wash your genitals with CHG. Wash your back and under your arms. Make sure to wash skin folds. Let the CHG sit on your skin for 1-2 minutes or as long as told. Rinse your entire body in the shower, including all body creases and folds. Turn off the shower. Dry off with a clean towel. Do not put anything on your skin afterward, such as powder, lotion, or perfume. Put on clean clothes or pajamas. If it's the night before surgery, sleep in clean sheets. General tips Use CHG only as told, and follow the instructions on the label. Use the full amount of CHG as told. This is often one bottle. Do not smoke and stay away from flames after using CHG. Your skin may feel sticky after using CHG. This is normal. The sticky feeling will go away as the CHG dries. Do not use CHG: If you have a chlorhexidine allergy or have reacted to chlorhexidine in the past. On open wounds or  areas of skin that have broken skin, cuts, or scrapes. On babies younger than 84 months of age. Contact a health care provider if: You have questions about using CHG. Your skin gets irritated or itchy. You have a rash after using CHG. You swallow any CHG. Call your local poison control center [phone] in the [place] Your eyes itch badly, or they become very red or swollen. Your hearing changes. You have trouble seeing. If you can't reach your provider, go to an urgent care or emergency room. Do not drive yourself. Get help right away if: You have swelling or tingling in your mouth or throat. You make high-pitched whistling sounds when you breathe, most often when you breathe out (wheeze). You have trouble breathing. These symptoms may be an emergency. Call 911 right away. Do not wait to see if the symptoms will go away. Do not drive yourself to the hospital. This information is not intended to replace advice given to you by your health care provider. Make sure you discuss any questions you have with your health care provider. Document Revised: 09/23/2022 Document Reviewed: 09/19/2021 Elsevier Patient Education  2024 [employer].

## 2023-06-01 ENCOUNTER — Encounter (HOSPITAL_COMMUNITY): Payer: Self-pay

## 2023-06-01 ENCOUNTER — Other Ambulatory Visit: Payer: Self-pay

## 2023-06-01 ENCOUNTER — Encounter (HOSPITAL_COMMUNITY)
Admission: RE | Admit: 2023-06-01 | Discharge: 2023-06-01 | Disposition: A | Discharge status: Home / Self Care | Payer: Medicare Other | Source: Ambulatory Visit | Attending: Urology | Admitting: Urology

## 2023-06-01 VITALS — BP 105/50 | HR 80 | Temp 98.0°F | Resp 18 | Ht 65.0 in | Wt 193.0 lb

## 2023-06-01 DIAGNOSIS — Z01812 Encounter for preprocedural laboratory examination: Secondary | ICD-10-CM | POA: Diagnosis not present

## 2023-06-01 DIAGNOSIS — D500 Iron deficiency anemia secondary to blood loss (chronic): Secondary | ICD-10-CM | POA: Diagnosis not present

## 2023-06-01 DIAGNOSIS — E119 Type 2 diabetes mellitus without complications: Secondary | ICD-10-CM | POA: Insufficient documentation

## 2023-06-01 DIAGNOSIS — Z01818 Encounter for other preprocedural examination: Secondary | ICD-10-CM | POA: Diagnosis present

## 2023-06-01 LAB — BASIC METABOLIC PANEL
Anion gap: 12 (ref 5–15)
BUN: 32 mg/dL — ABNORMAL HIGH (ref 8–23)
CO2: 25 mmol/L (ref 22–32)
Calcium: 9.5 mg/dL (ref 8.9–10.3)
Chloride: 103 mmol/L (ref 98–111)
Creatinine, Ser: 0.81 mg/dL (ref 0.44–1.00)
GFR, Estimated: >60 mL/min (ref >60)
Glucose, Bld: 91 mg/dL (ref 70–99)
Potassium: 3.9 mmol/L (ref 3.5–5.1)
Sodium: 140 mmol/L (ref 135–145)

## 2023-06-01 LAB — CBC WITH DIFFERENTIAL/PLATELET
Abs Immature Granulocytes: 0.04 10*3/uL (ref 0.00–0.07)
Basophils Absolute: 0.1 10*3/uL (ref 0.0–0.1)
Basophils Relative: 1 %
Eosinophils Absolute: 0.3 10*3/uL (ref 0.0–0.5)
Eosinophils Relative: 3 %
HCT: 43.8 % (ref 36.0–46.0)
Hemoglobin: 14.3 g/dL (ref 12.0–15.0)
Immature Granulocytes: 0 %
Lymphocytes Relative: 20 %
Lymphs Abs: 2.1 10*3/uL (ref 0.7–4.0)
MCH: 29.4 pg (ref 26.0–34.0)
MCHC: 32.6 g/dL (ref 30.0–36.0)
MCV: 90.1 fL (ref 80.0–100.0)
Monocytes Absolute: 0.7 10*3/uL (ref 0.1–1.0)
Monocytes Relative: 7 %
Neutro Abs: 7.4 10*3/uL (ref 1.7–7.7)
Neutrophils Relative %: 69 %
Platelets: 311 10*3/uL (ref 150–400)
RBC: 4.86 MIL/uL (ref 3.87–5.11)
RDW: 14.9 % (ref 11.5–15.5)
WBC: 10.6 10*3/uL — ABNORMAL HIGH (ref 4.0–10.5)
nRBC: 0 % (ref 0.0–0.2)

## 2023-06-01 LAB — HEMOGLOBIN A1C
Hgb A1c MFr Bld: 5.2 % (ref 4.8–5.6)
Mean Plasma Glucose: 102.54 mg/dL

## 2023-06-01 NOTE — Progress Notes (Signed)
PAT visit completed. Pt verbalized understanding on procedure instructions and arrival time.   

## 2023-06-02 ENCOUNTER — Telehealth: Payer: Self-pay

## 2023-06-02 ENCOUNTER — Other Ambulatory Visit

## 2023-06-02 DIAGNOSIS — R399 Unspecified symptoms and signs involving the genitourinary system: Secondary | ICD-10-CM

## 2023-06-02 LAB — MICROSCOPIC EXAMINATION: WBC, UA: >30 /HPF — AB (ref 0–5)

## 2023-06-02 LAB — URINALYSIS, ROUTINE W REFLEX MICROSCOPIC
Bilirubin, UA: NEGATIVE
Glucose, UA: NEGATIVE
Ketones, UA: NEGATIVE
Nitrite, UA: NEGATIVE
RBC, UA: NEGATIVE
Specific Gravity, UA: 1.015 (ref 1.005–1.030)
Urobilinogen, Ur: 0.2 mg/dL (ref 0.2–1.0)
pH, UA: 6.5 (ref 5.0–7.5)

## 2023-06-02 NOTE — Telephone Encounter (Signed)
 Dysuria  Patient called with c/o dysuria x 2-3 days days.  Pain: burning and frequency and cloudy urine   Severity:7/10  Associated Signs and Symptoms:  Fever: noTemp.0 Chills: no Hematuria: no Urgency: yes Frequency: yes Hesitancy:no Incontinence: yes Nausea: no Vomiting: no  Urologic History:  Any Recent Urologic Surgeries or Procedures:no Recurrent UTI's:yes Cystitis: no  Prostatitis:no Kidney or Bladder Stones: yes Plan: Walk-in Clinic: no Appointment w/Physician: [no Lab visit scheduled for urine drop off: Yes Advice given:  Do you take on daily medications for UTI suppression No

## 2023-06-02 NOTE — Telephone Encounter (Signed)
 Pt states she got up this morning with a UTI has surgery on Thursday has appt in [place] this am but okay to do a scheduled urine drop off this afternoon at 1:30PM Pt advised wasn't urine is back and we here from MD we will let her know what he says about surgery

## 2023-06-03 MED ORDER — DOXYCYCLINE HYCLATE 100 MG PO CAPS
100.0000 mg | ORAL_CAPSULE | Freq: Two times a day (BID) | ORAL | 0 refills | Status: DC
Start: 2023-06-03 — End: 2023-08-12

## 2023-06-03 NOTE — Telephone Encounter (Signed)
 New surgery date confirmed with patient, will send updated surgery information to mychart.

## 2023-06-03 NOTE — Addendum Note (Signed)
 Addended by: Christoper Fabian R on: 06/03/2023 09:47 AM   Modules accepted: Orders

## 2023-06-03 NOTE — Telephone Encounter (Signed)
 Patient was made of MD recommendation. Patient's state's she is on Dr. Ronne Binning surgery tommorow and want to know and need to cancel surgery. Per MD McKenzie surgery need to be cancel. Patient state's she took Macrobid in the past and did not help with her UTI. Per MD McKenzie change ABT to  doxy to cover infection. Patient voiced understanding. This was the other pt that I covered up. She looked like she has uti. If no one else has sen results, call in macrobid 1  po bid # 10

## 2023-06-04 LAB — URINE CULTURE

## 2023-06-15 ENCOUNTER — Encounter: Payer: Medicare Other | Admitting: Urology

## 2023-06-23 ENCOUNTER — Encounter (HOSPITAL_COMMUNITY): Payer: Self-pay

## 2023-06-23 ENCOUNTER — Other Ambulatory Visit: Payer: Self-pay

## 2023-06-23 ENCOUNTER — Encounter (HOSPITAL_COMMUNITY)
Admission: RE | Admit: 2023-06-23 | Discharge: 2023-06-23 | Disposition: A | Discharge status: Home / Self Care | Source: Ambulatory Visit | Attending: Urology | Admitting: Urology

## 2023-06-25 ENCOUNTER — Ambulatory Visit (HOSPITAL_COMMUNITY)
Admission: RE | Admit: 2023-06-25 | Discharge: 2023-06-25 | Disposition: A | Discharge status: Home / Self Care | Payer: Medicare Other | Attending: Urology | Admitting: Urology

## 2023-06-25 ENCOUNTER — Ambulatory Visit (HOSPITAL_COMMUNITY): Admitting: Anesthesiology

## 2023-06-25 ENCOUNTER — Encounter (HOSPITAL_COMMUNITY)
Admission: RE | Disposition: A | Discharge status: Home / Self Care | Payer: Self-pay | Source: Home / Self Care | Attending: Urology

## 2023-06-25 ENCOUNTER — Ambulatory Visit (HOSPITAL_COMMUNITY)

## 2023-06-25 DIAGNOSIS — E119 Type 2 diabetes mellitus without complications: Secondary | ICD-10-CM | POA: Diagnosis not present

## 2023-06-25 DIAGNOSIS — G4730 Sleep apnea, unspecified: Secondary | ICD-10-CM | POA: Insufficient documentation

## 2023-06-25 DIAGNOSIS — F419 Anxiety disorder, unspecified: Secondary | ICD-10-CM | POA: Diagnosis not present

## 2023-06-25 DIAGNOSIS — Z87440 Personal history of urinary (tract) infections: Secondary | ICD-10-CM | POA: Insufficient documentation

## 2023-06-25 DIAGNOSIS — I471 Supraventricular tachycardia, unspecified: Secondary | ICD-10-CM | POA: Insufficient documentation

## 2023-06-25 DIAGNOSIS — M1990 Unspecified osteoarthritis, unspecified site: Secondary | ICD-10-CM | POA: Diagnosis not present

## 2023-06-25 DIAGNOSIS — F32A Depression, unspecified: Secondary | ICD-10-CM | POA: Diagnosis not present

## 2023-06-25 DIAGNOSIS — N200 Calculus of kidney: Secondary | ICD-10-CM | POA: Insufficient documentation

## 2023-06-25 DIAGNOSIS — Z87891 Personal history of nicotine dependence: Secondary | ICD-10-CM | POA: Insufficient documentation

## 2023-06-25 DIAGNOSIS — I10 Essential (primary) hypertension: Secondary | ICD-10-CM

## 2023-06-25 DIAGNOSIS — Z7985 Long-term (current) use of injectable non-insulin antidiabetic drugs: Secondary | ICD-10-CM | POA: Insufficient documentation

## 2023-06-25 HISTORY — PX: CYSTOSCOPY WITH RETROGRADE PYELOGRAM, URETEROSCOPY AND STENT PLACEMENT: SHX5789

## 2023-06-25 HISTORY — PX: HOLMIUM LASER APPLICATION: SHX5852

## 2023-06-25 LAB — GLUCOSE, CAPILLARY: Glucose-Capillary: 90 mg/dL (ref 70–99)

## 2023-06-25 SURGERY — CYSTOURETEROSCOPY, WITH RETROGRADE PYELOGRAM AND STENT INSERTION
Anesthesia: General | Site: Ureter | Laterality: Right

## 2023-06-25 MED ORDER — CEFAZOLIN SODIUM-DEXTROSE 2-4 GM/100ML-% IV SOLN
INTRAVENOUS | Status: AC
  Filled 2023-06-25: qty 100

## 2023-06-25 MED ORDER — ONDANSETRON HCL 4 MG/2ML IJ SOLN
INTRAMUSCULAR | Status: AC
  Filled 2023-06-25: qty 2

## 2023-06-25 MED ORDER — GLYCOPYRROLATE PF 0.2 MG/ML IJ SOSY
PREFILLED_SYRINGE | INTRAMUSCULAR | Status: DC | PRN
  Administered 2023-06-25: .2 mg via INTRAVENOUS

## 2023-06-25 MED ORDER — HYDROCODONE-ACETAMINOPHEN 5-325 MG PO TABS: 1.0000 | ORAL_TABLET | Freq: Four times a day (QID) | ORAL | 0 refills | Status: DC | PRN

## 2023-06-25 MED ORDER — GLYCOPYRROLATE PF 0.2 MG/ML IJ SOSY
PREFILLED_SYRINGE | INTRAMUSCULAR | Status: AC
  Filled 2023-06-25: qty 1

## 2023-06-25 MED ORDER — EPHEDRINE SULFATE (PRESSORS) 50 MG/ML IJ SOLN
INTRAMUSCULAR | Status: DC | PRN
  Administered 2023-06-25 (×2): 10 mg via INTRAVENOUS

## 2023-06-25 MED ORDER — PHENYLEPHRINE 80 MCG/ML (10ML) SYRINGE FOR IV PUSH (FOR BLOOD PRESSURE SUPPORT)
PREFILLED_SYRINGE | INTRAVENOUS | Status: AC
  Filled 2023-06-25: qty 10

## 2023-06-25 MED ORDER — EPHEDRINE 5 MG/ML INJ
INTRAVENOUS | Status: AC
  Filled 2023-06-25: qty 5

## 2023-06-25 MED ORDER — DIATRIZOATE MEGLUMINE 30 % UR SOLN
URETHRAL | Status: DC | PRN
  Administered 2023-06-25: 8 mL via URETHRAL

## 2023-06-25 MED ORDER — WATER FOR IRRIGATION, STERILE IR SOLN
Status: DC | PRN
  Administered 2023-06-25: 500 mL

## 2023-06-25 MED ORDER — LIDOCAINE HCL (PF) 2 % IJ SOLN
INTRAMUSCULAR | Status: AC
  Filled 2023-06-25: qty 10

## 2023-06-25 MED ORDER — PROPOFOL 10 MG/ML IV BOLUS
INTRAVENOUS | Status: DC | PRN
  Administered 2023-06-25: 150 mg via INTRAVENOUS

## 2023-06-25 MED ORDER — FENTANYL CITRATE (PF) 100 MCG/2ML IJ SOLN
INTRAMUSCULAR | Status: AC
  Filled 2023-06-25: qty 2

## 2023-06-25 MED ORDER — PROPOFOL 10 MG/ML IV BOLUS
INTRAVENOUS | Status: AC
  Filled 2023-06-25: qty 20

## 2023-06-25 MED ORDER — LACTATED RINGERS IV SOLN: INTRAVENOUS | Status: DC | PRN

## 2023-06-25 MED ORDER — FENTANYL CITRATE (PF) 100 MCG/2ML IJ SOLN
INTRAMUSCULAR | Status: DC | PRN
Start: 2023-06-25 — End: 2023-06-25
  Administered 2023-06-25: 25 ug via INTRAVENOUS

## 2023-06-25 MED ORDER — CEFAZOLIN SODIUM-DEXTROSE 2-3 GM-%(50ML) IV SOLR
INTRAVENOUS | Status: DC | PRN
  Administered 2023-06-25: 2 g via INTRAVENOUS

## 2023-06-25 MED ORDER — EPHEDRINE SULFATE-NACL 50-0.9 MG/10ML-% IV SOSY
PREFILLED_SYRINGE | INTRAVENOUS | Status: DC | PRN
  Administered 2023-06-25: 10 mg via INTRAVENOUS

## 2023-06-25 MED ORDER — SODIUM CHLORIDE 0.9 % IR SOLN
Status: DC | PRN
  Administered 2023-06-25: 3000 mL

## 2023-06-25 MED ORDER — ALFUZOSIN HCL ER 10 MG PO TB24: 10.0000 mg | ORAL_TABLET | Freq: Every day | ORAL | 0 refills | Status: DC

## 2023-06-25 MED ORDER — LIDOCAINE HCL (PF) 2 % IJ SOLN
INTRAMUSCULAR | Status: DC | PRN
  Administered 2023-06-25: 60 mg via INTRADERMAL

## 2023-06-25 MED ORDER — ONDANSETRON HCL 4 MG/2ML IJ SOLN
INTRAMUSCULAR | Status: DC | PRN
  Administered 2023-06-25: 4 mg via INTRAVENOUS

## 2023-06-25 MED ORDER — PHENYLEPHRINE 80 MCG/ML (10ML) SYRINGE FOR IV PUSH (FOR BLOOD PRESSURE SUPPORT)
PREFILLED_SYRINGE | INTRAVENOUS | Status: DC | PRN
Start: 2023-06-25 — End: 2023-06-25
  Administered 2023-06-25 (×2): 160 ug via INTRAVENOUS

## 2023-06-25 MED ORDER — DIATRIZOATE MEGLUMINE 30 % UR SOLN
URETHRAL | Status: AC
  Filled 2023-06-25: qty 100

## 2023-06-25 SURGICAL SUPPLY — 24 items
BAG DRAIN URO TABLE W/ADPT NS (BAG) ×1 IMPLANT
BAG HAMPER (MISCELLANEOUS) ×1 IMPLANT
CATH INTERMIT  6FR 70CM (CATHETERS) ×1 IMPLANT
CLOTH BEACON ORANGE TIMEOUT ST (SAFETY) ×1 IMPLANT
DECANTER SPIKE VIAL GLASS SM (MISCELLANEOUS) ×1 IMPLANT
GLOVE BIO SURGEON STRL SZ8 (GLOVE) ×1 IMPLANT
GLOVE BIOGEL PI IND STRL 7.0 (GLOVE) ×2 IMPLANT
GOWN STRL REUS W/TWL LRG LVL3 (GOWN DISPOSABLE) ×1 IMPLANT
GOWN STRL REUS W/TWL XL LVL3 (GOWN DISPOSABLE) ×1 IMPLANT
GUIDEWIRE STR DUAL SENSOR (WIRE) ×1 IMPLANT
GUIDEWIRE STR ZIPWIRE 035X150 (MISCELLANEOUS) ×1 IMPLANT
KIT TURNOVER CYSTO (KITS) ×1 IMPLANT
MANIFOLD NEPTUNE II (INSTRUMENTS) ×1 IMPLANT
PACK CYSTO (CUSTOM PROCEDURE TRAY) ×1 IMPLANT
PAD ARMBOARD POSITIONER FOAM (MISCELLANEOUS) ×1 IMPLANT
POSITIONER HEAD 8X9X4 ADT (SOFTGOODS) ×1 IMPLANT
SHEATH URETERAL 12FRX35CM (MISCELLANEOUS) IMPLANT
SOL .9 NS 3000ML IRR UROMATIC (IV SOLUTION) ×2 IMPLANT
STENT URET 6FRX26 CONTOUR (STENTS) IMPLANT
SYR 10ML LL (SYRINGE) ×1 IMPLANT
SYR CONTROL 10ML LL (SYRINGE) ×1 IMPLANT
TOWEL OR 17X26 4PK STRL BLUE (TOWEL DISPOSABLE) ×1 IMPLANT
TRACTIP FLEXIVA PULS ID 200XHI (Laser) IMPLANT
WATER STERILE IRR 500ML POUR (IV SOLUTION) ×1 IMPLANT

## 2023-06-25 NOTE — Transfer of Care (Signed)
 Immediate Anesthesia Transfer of Care Note  Patient: Felicia Hogan  Procedure(s) Performed: Tammi Klippel, WITH RETROGRADE PYELOGRAM AND STENT INSERTION (Right: Ureter) HOLMIUM LASER APPLICATION (Right: Ureter)  Patient Location: PACU  Anesthesia Type:General  Level of Consciousness: awake, alert , oriented, and patient cooperative  Airway & Oxygen Therapy: Patient Spontanous Breathing  Post-op Assessment: Report given to RN, Post -op Vital signs reviewed and stable, and Patient moving all extremities X 4  Post vital signs: Reviewed and stable  Last Vitals:  Vitals Value Taken Time  BP 122/59 06/25/23 1233  Temp 36.9 C 06/25/23 1233  Pulse 87 06/25/23 1239  Resp 11 06/25/23 1239  SpO2 97 % 06/25/23 1239  Vitals shown include unfiled device data.  Last Pain:  Vitals:   06/25/23 1233  TempSrc:   PainSc: 0-No pain      Patients Stated Pain Goal: 4 (06/25/23 1233)  Complications: No notable events documented.

## 2023-06-25 NOTE — Op Note (Signed)
 Preoperative diagnosis: Right renal stone  Postoperative diagnosis: Same  Procedure: 1 cystoscopy 2. Right retrograde pyelography 3.  Intraoperative fluoroscopy, under one hour, with interpretation 4.  Right ureteroscopic stone manipulation with laser lithotripsy 5.  right 6 x 26 JJ stent placement  Attending: Wilkie Aye  Anesthesia: General  Estimated blood loss: None  Drains: Right 6 x 26 JJ ureteral stent with tether  Specimens: none  Antibiotics: ancef  Findings: Right Upper pole stone. No hydronephrosis. No masses/lesions in the bladder. Ureteral orifices in normal anatomic location.  Indications: Patient is a 78 year old female with a history of right renal stone which has been growing in size.  After discussing treatment options, she decided proceed with right ureteroscopic stone manipulation.  Procedure in detail: The patient was brought to the operating room and a brief timeout was done to ensure correct patient, correct procedure, correct site.  General anesthesia was administered patient was placed in dorsal lithotomy position.  Her genitalia was then prepped and draped in usual sterile fashion.  A rigid 22 French cystoscope was passed in the urethra and the bladder.  Bladder was inspected free masses or lesions.  the ureteral orifices were in the normal orthotopic locations.  a 6 french ureteral catheter was then instilled into the right ureteral orifice.  a gentle retrograde was obtained and findings noted above.  we then placed a zip wire through the ureteral catheter and advanced up to the renal pelvis.  we then removed the cystoscope and cannulated the right ureteral orifice with a semirigid ureteroscope.  No stone was found in the ureter. Once we reached the UPJ a sensor wire was advanced in to the renal pelvis. We then removed the ureteroscope and advanced am 12/14 x 35cm access sheath up to the renal pelvis. We then used the flexible ureteroscope to perform  nephroscopy. We encountered the stone in the upper pole. Using a 242nm laser fiber the stone was dusted. The larger fragments were removed.   once all stone fragments were removed we then removed the access sheath under direct vision and noted no injury to the ureter. We then placed a 6 x 26 double-j ureteral stent over the original zip wire.  We then removed the wire and good coil was noted in the the renal pelvis under fluoroscopy and the bladder under direct vision. the bladder was then drained and this concluded the procedure which was well tolerated by patient.  Complications: None  Condition: Stable, extubated, transferred to PACU  Plan: Patient is to be discharged home as to follow-up in one week. She is to remove her stent in 72 hours by pulling the tether

## 2023-06-25 NOTE — Discharge Instructions (Signed)

## 2023-06-25 NOTE — Anesthesia Preprocedure Evaluation (Addendum)
 Anesthesia Evaluation  Patient identified by MRN, date of birth, ID band Patient awake    Reviewed: Allergy & Precautions, H&P , NPO status , Patient's Chart, lab work & pertinent test results, reviewed documented beta blocker date and time   Airway Mallampati: II  TM Distance: >3 FB Neck ROM: full    Dental no notable dental hx. (+) Dental Advisory Given, Teeth Intact   Pulmonary sleep apnea , former smoker   Pulmonary exam normal breath sounds clear to auscultation       Cardiovascular Exercise Tolerance: Good hypertension, Normal cardiovascular exam+ dysrhythmias Supra Ventricular Tachycardia  Rhythm:regular Rate:Normal     Neuro/Psych  PSYCHIATRIC DISORDERS Anxiety Depression    negative neurological ROS     GI/Hepatic negative GI ROS, Neg liver ROS,,,  Endo/Other  diabetes, Type 2    Renal/GU   negative genitourinary   Musculoskeletal  (+) Arthritis , Osteoarthritis,    Abdominal   Peds  Hematology   Anesthesia Other Findings   Reproductive/Obstetrics negative OB ROS                             Anesthesia Physical Anesthesia Plan  ASA: 3  Anesthesia Plan: General and General LMA   Post-op Pain Management:    Induction:   PONV Risk Score and Plan: Ondansetron and Dexamethasone  Airway Management Planned: Oral ETT  Additional Equipment: None  Intra-op Plan:   Post-operative Plan: Extubation in OR  Informed Consent: I have reviewed the patients History and Physical, chart, labs and discussed the procedure including the risks, benefits and alternatives for the proposed anesthesia with the patient or authorized representative who has indicated his/her understanding and acceptance.     Dental Advisory Given  Plan Discussed with: CRNA  Anesthesia Plan Comments:        Anesthesia Quick Evaluation

## 2023-06-25 NOTE — H&P (Signed)
 HPI: Ms Felicia Hogan is a 78yo here for right ureteroscopic stone extraction. KUB shows right upper pole calculus and no definitive left renal calculi. She was treated with cipro for a UTI on 1/9. Ionized calcium 5.1, PTH 31. NO stone events since last visit. No flank pain. She is having new urinary urgency and frequency. UA is concerning for infection. She is on potassium citrate BID and indapamide 2.5mg  daily      PMH:     Past Medical History:  Diagnosis Date   Acute pyelonephritis 01/24/2018   Anxiety     Arthritis     Depression     Diabetes mellitus without complication (HCC)     Dyslipidemia     History of kidney stones     Hypertension     Iron deficiency anemia due to chronic blood loss     Mucous membrane pemphigoid 2014    so far only infects mouth. treated with oral steroids for flares. Lidex as well.    Sleep apnea            Surgical History:      Past Surgical History:  Procedure Laterality Date   BIOPSY   11/17/2017    Procedure: BIOPSY;  Surgeon: West Bali, MD;  Location: [facility];  Service: Endoscopy;;  gastric duodenum   CESAREAN SECTION        twice   CHOLECYSTECTOMY       COLONOSCOPY   10/2012    Dr. Aleene Davidson: normal. due 10/2017.    COLONOSCOPY WITH PROPOFOL N/A 11/17/2017    Procedure: COLONOSCOPY WITH PROPOFOL;  Surgeon: West Bali, MD;  Location: [facility];  Service: Endoscopy;  Laterality: N/A;  8:45am   CYSTOSCOPY WITH RETROGRADE PYELOGRAM, URETEROSCOPY AND STENT PLACEMENT Left 12/04/2022    Procedure: CYSTOSCOPY WITH RETROGRADE PYELOGRAM, URETEROSCOPY AND STENT PLACEMENT;  Surgeon: Malen Gauze, MD;  Location: [facility];  Service: Urology;  Laterality: Left;   CYSTOSCOPY WITH RETROGRADE PYELOGRAM, URETEROSCOPY AND STENT PLACEMENT Left 01/22/2023    Procedure: CYSTOSCOPY WITH RETROGRADE PYELOGRAM, URETEROSCOPY AND STENT PLACEMENT;  Surgeon: Malen Gauze, MD;  Location: [facility];  Service: Urology;  Laterality: Left;    ESOPHAGOGASTRODUODENOSCOPY (EGD) WITH PROPOFOL N/A 11/17/2017    Procedure: ESOPHAGOGASTRODUODENOSCOPY (EGD) WITH PROPOFOL;  Surgeon: West Bali, MD;  Location: [facility];  Service: Endoscopy;  Laterality: N/A;   GIVENS CAPSULE STUDY N/A 11/30/2017    Procedure: GIVENS CAPSULE STUDY;  Surgeon: West Bali, MD;  Location: [facility];  Service: Endoscopy;  Laterality: N/A;  7:30am   HOLMIUM LASER APPLICATION Left 12/04/2022    Procedure: HOLMIUM LASER APPLICATION;  Surgeon: Malen Gauze, MD;  Location: [facility];  Service: Urology;  Laterality: Left;   HOLMIUM LASER APPLICATION Left 01/22/2023    Procedure: HOLMIUM LASER APPLICATION;  Surgeon: Malen Gauze, MD;  Location: [facility];  Service: Urology;  Laterality: Left;   LITHOTRIPSY   2017   POLYPECTOMY   11/17/2017    Procedure: POLYPECTOMY;  Surgeon: West Bali, MD;  Location: [facility];  Service: Endoscopy;;  colon     REPLACEMENT TOTAL KNEE BILATERAL       TONSILLECTOMY       TOTAL HIP ARTHROPLASTY Right            Home Medications:  Allergies as of 05/18/2023         Reactions    Nsaids Itching, Rash    If given in large  doses.     Sulfa Antibiotics Rash    Erythromycin Hives, Rash    Tamsulosin Itching, Rash    Tape Itching, Rash            Medication List           Accurate as of May 18, 2023 10:49 AM. If you have any questions, ask your nurse or doctor.              acetaminophen 650 MG CR tablet Commonly known as: TYLENOL Take 1,300 mg by mouth daily as needed for pain.    alfuzosin 10 MG 24 hr tablet Commonly known as: UROXATRAL TAKE 1 TABLET BY MOUTH AT BEDTIME    BIOTIN PO Take 10,000 mcg by mouth 2 (two) times daily.    COLLAGEN PO Take 1 Scoop by mouth daily.    desvenlafaxine 50 MG 24 hr tablet Commonly known as: PRISTIQ Take 50 mg by mouth daily.    estradiol 0.1 MG/GM vaginal cream Commonly known as: ESTRACE Place 1 Applicatorful vaginally 2 (two) times a  week.    Fish Oil 1200 MG Caps Take 1,200 mg by mouth 2 (two) times daily.    indapamide 2.5 MG tablet Commonly known as: LOZOL Take 1 tablet (2.5 mg total) by mouth daily.    metoprolol tartrate 25 MG tablet Commonly known as: LOPRESSOR Take 1 tablet (25 mg total) by mouth 2 (two) times daily.    Potassium Citrate 15 MEQ (1620 MG) Tbcr Commonly known as: Urocit-K 15 Take 1 tablet by mouth 2 (two) times daily.    PRESERVISION AREDS PO Take 1 capsule by mouth 2 (two) times daily.    PROBIOTIC-PREBIOTIC PO Take 1 capsule by mouth daily. 31 billion CFU    tirzepatide 15 MG/0.5ML Pen Commonly known as: MOUNJARO Inject 15 mg into the skin every Sunday.    TURMERIC CURCUMIN PO Take 900 mg by mouth 2 (two) times daily.             Allergies:  Allergies       Allergies  Allergen Reactions   Nsaids Itching and Rash      If given in large doses.      Sulfa Antibiotics Rash   Erythromycin Hives and Rash   Tamsulosin Itching and Rash   Tape Itching and Rash        Family History:      Family History  Problem Relation Age of Onset   Liver cancer Mother          ?started in pancreas   Parkinson's disease Father     Pneumonia Father     Colon cancer Father 43        metastatic but did well   Thyroid cancer Father     Other Daughter 10        brain tumor, followed with serial MRIs. now age 8   Colon cancer Paternal Aunt 2          Social History:  reports that she quit smoking about 55 years ago. Her smoking use included cigarettes. She started smoking about 56 years ago. She has a 0.3 pack-year smoking history. She has never used smokeless tobacco. She reports current alcohol use. She reports that she does not use drugs.   ROS: All other review of systems were reviewed and are negative except what is noted above in HPI   Physical Exam: BP (!) 105/50   Pulse 80   Constitutional:  Alert and  oriented, No acute distress. HEENT: [place] AT, moist mucus membranes.   Trachea midline, no masses. Cardiovascular: No clubbing, cyanosis, or edema. Respiratory: Normal respiratory effort, no increased work of breathing. GI: Abdomen is soft, nontender, nondistended, no abdominal masses GU: No CVA tenderness.  Lymph: No cervical or inguinal lymphadenopathy. Skin: No rashes, bruises or suspicious lesions. Neurologic: Grossly intact, no focal deficits, moving all 4 extremities. Psychiatric: Normal mood and affect.   Laboratory Data: Recent Labs       Lab Results  Component Value Date    WBC 8.8 10/23/2022    HGB 14.6 10/23/2022    HCT 44.8 10/23/2022    MCV 86.8 10/23/2022    PLT 327 10/23/2022        Recent Labs       Lab Results  Component Value Date    CREATININE 0.77 12/23/2022        Recent Labs  No results found for: "PSA"     Recent Labs  No results found for: "TESTOSTERONE"     Recent Labs  No results found for: "HGBA1C"     Urinalysis Labs (Brief)          Component Value Date/Time    COLORURINE YELLOW 10/23/2022 1856    APPEARANCEUR Cloudy (A) 04/02/2023 1527    LABSPEC 1.010 10/23/2022 1856    PHURINE 6.0 10/23/2022 1856    GLUCOSEU Negative 04/02/2023 1527    HGBUR MODERATE (A) 10/23/2022 1856    BILIRUBINUR Negative 04/02/2023 1527    KETONESUR NEGATIVE 10/23/2022 1856    PROTEINUR Trace 04/02/2023 1527    PROTEINUR 100 (A) 10/23/2022 1856    NITRITE Negative 04/02/2023 1527    NITRITE NEGATIVE 10/23/2022 1856    LEUKOCYTESUR 3+ (A) 04/02/2023 1527    LEUKOCYTESUR LARGE (A) 10/23/2022 1856        Recent Labs       Lab Results  Component Value Date    LABMICR See below: 04/02/2023    WBCUA >30 (A) 04/02/2023    LABEPIT 0-10 04/02/2023    BACTERIA Few (A) 04/02/2023        Pertinent Imaging: KUB today: Images reviewed and discussed with the patient  Results for orders placed during the hospital encounter of 10/22/22   Abdomen 1 view (KUB)   Narrative CLINICAL DATA:  Nephrolithiasis    EXAM: ABDOMEN - 1 VIEW   COMPARISON:  07/23/2022, 04/14/2022, CT 01/25/2018   FINDINGS: Right hip replacement with normal alignment. Nonobstructed gas pattern with moderate stool. Redemonstrated bilateral kidney stones. 11 mm stone projecting over upper pole of right kidney. Punctate stone projects over lower pole right kidney. Dominant 15 mm left kidney stone with suspected smaller 8 mm stone. Findings do not appear significantly changed.   IMPRESSION: Bilateral kidney stones, not significantly changed.     Electronically Signed By: Jasmine Pang M.D. On: 10/23/2022 19:17   No results found for this or any previous visit.   No results found for this or any previous visit.   No results found for this or any previous visit.   Results for orders placed during the hospital encounter of 02/05/23   Ultrasound renal complete   Narrative : PROCEDURE: US RENAL   HISTORY: Patient is a 78 y/o  F with nephrolithiasis.   COMPARISON: CT abdomen 12/17/2022, 12/10/2022.   TECHNIQUE: Two-dimensional grayscale and color Doppler ultrasound of the kidneys and bladder was performed.   FINDINGS: The bladder demonstrates a normal anechoic echogenicity without wall thickening. Prevoid bladder  volume is 3.1 mL. The urinary bladder is empty postvoid. Bilateral ureteral jets are visualized.   The right kidney measures 13.0 cm in length. Renal cortical echotexture is normal. There is no hydronephrosis. There are multiple calculi, largest measuring 1.7 cm. There are no cysts.   The left kidney measures 13.4 cm in length. Renal cortical echotexture is normal. There is fullness of the upper calyx without hydronephrosis. This persists postvoid. There are multiple calculi, largest measuring 1.2 cm. There are no cysts.   IMPRESSION: 1. Multiple bilateral renal calculi.  Concordant with prior CT.   2.  Fullness of left renal upper pole calyx.   Thank you for allowing [place] to assist in the  care of this patient.     Electronically Signed By: Lestine Box M.D. On: 02/16/2023 12:27   No results found for this or any previous visit.   No results found for this or any previous visit.   Results for orders placed in visit on 12/17/22   CT RENAL STONE STUDY   Narrative CLINICAL DATA:  Urinary tract calculi.   EXAM: CT ABDOMEN AND PELVIS WITHOUT CONTRAST   TECHNIQUE: Multidetector CT imaging of the abdomen and pelvis was performed following the standard protocol without IV contrast.   RADIATION DOSE REDUCTION: This exam was performed according to the departmental dose-optimization program which includes automated exposure control, adjustment of the mA and/or kV according to patient size and/or use of iterative reconstruction technique.   COMPARISON:  01/24/2018 and abdomen radiograph from 10/22/2022   FINDINGS: Lower chest: Stable 5 by 3 mm subpleural nodule in the left lower lobe on image 19 series 4, stability over the last 5 years implies benign etiology. No further imaging workup of this lesion is indicated.   Mitral valve calcification.   Hepatobiliary: Cholecystectomy.  Otherwise unremarkable.   Pancreas: Unremarkable   Spleen: Unremarkable   Adrenals/Urinary Tract: 1.5 cm Bosniak category 1 cyst of the right kidney lower pole anteriorly on image 43 series 2. No further imaging workup of this lesion is indicated.   Obscuration of the right distal ureter and right side of the urinary bladder due to streak artifact from the patient's right hip implant.   Three nonobstructive right renal calculi are present, largest measures 1.1 cm in the upper pole.   Three nonobstructive left kidney lower pole calculi are observed with some staghorn characteristics, the largest is 1.6 cm in the lower pole.   Left double-J ureteral stent is present, no definite ureteral stone along the margin of the stent identified. Minimal caliectasis on the left without  hydroureter. Stent positioning satisfactory.   Stomach/Bowel: Lax anterior abdominal wall with rectus diastasis, abdominal contents overhang the proximal femurs through this laxity along the pannus. Air fluid levels in the sigmoid colon which could reflect diarrheal process. No dilated bowel.   Vascular/Lymphatic: Atherosclerosis is present, including aortoiliac atherosclerotic disease.   Reproductive: Unremarkable   Other: No supplemental non-categorized findings.   Musculoskeletal: Right total hip prosthesis. 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 Grade 1 degenerative retrolisthesis at L2-3 and grade 1 degenerative anterolisthesis at L4-5.   IMPRESSION: 1. Bilateral nonobstructive nephrolithiasis. 2. Left double-J ureteral stent in place, no definite ureteral stone along the margin of the stent identified. Minimal caliectasis on the left without hydroureter. 3. Air fluid levels in the sigmoid colon which could reflect diarrheal process. 4. Aortic atherosclerosis. 5. 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 6.  Lax anterior abdominal wall with rectus diastasis, abdominal contents overhang the proximal femurs through this laxity along the pannus.   Aortic Atherosclerosis (ICD10-I70.0).     Electronically Signed By: Gaylyn Rong M.D. On: 12/17/2022 13:43     Assessment & Plan:     1. Renal stone (Primary) -We discussed the management of kidney stones. These options include observation, ureteroscopy, shockwave lithotripsy (ESWL) and percutaneous nephrolithotomy (PCNL). We discussed which options are relevant to the patient's stone(s). We discussed the natural history of kidney stones as well as the complications of untreated stones and the impact on quality of life without treatment as well as with each of the above listed treatments. We also discussed  the efficacy of each treatment in its ability to clear the stone burden. With any of these management options I discussed the signs and symptoms of infection and the need for emergent treatment should these be experienced. For each option we discussed the ability of each procedure to clear the patient of their stone burden.   For observation I described the risks which include but are not limited to silent renal damage, life-threatening infection, need for emergent surgery, failure to pass stone and pain.   For ureteroscopy I described the risks which include bleeding, infection, damage to contiguous structures, positioning injury, ureteral stricture, ureteral avulsion, ureteral injury, need for prolonged ureteral stent, inability to perform ureteroscopy, need for an interval procedure, inability to clear stone burden, stent discomfort/pain, heart attack, stroke, pulmonary embolus and the inherent risks with general anesthesia.   For shockwave lithotripsy I described the risks which include arrhythmia, kidney contusion, kidney hemorrhage, need for transfusion, pain, inability to adequately break up stone, inability to pass stone fragments, Steinstrasse, infection associated with obstructing stones, need for alternate surgical procedure, need for repeat shockwave lithotripsy, MI, CVA, PE and the inherent risks with anesthesia/conscious sedation.   For PCNL I described the risks including positioning injury, pneumothorax, hydrothorax, need for chest tube, inability to clear stone burden, renal laceration, arterial venous fistula or malformation, need for embolization of kidney, loss of kidney or renal function, need for repeat procedure, need for prolonged nephrostomy tube, ureteral avulsion, MI, CVA, PE and the inherent risks of general anesthesia.   - The patient would like to proceed with right ureteroscopic stone extraction

## 2023-06-25 NOTE — Anesthesia Postprocedure Evaluation (Signed)
 Anesthesia Post Note  Patient: Felicia Hogan  Procedure(s) Performed: Tammi Klippel, WITH RETROGRADE PYELOGRAM AND STENT INSERTION (Right: Ureter) HOLMIUM LASER APPLICATION (Right: Ureter)  Patient location during evaluation: PACU Anesthesia Type: General Level of consciousness: awake and alert Pain management: pain level controlled Vital Signs Assessment: post-procedure vital signs reviewed and stable Respiratory status: spontaneous breathing, nonlabored ventilation, respiratory function stable and patient connected to nasal cannula oxygen Cardiovascular status: blood pressure returned to baseline and stable Postop Assessment: no apparent nausea or vomiting Anesthetic complications: no   There were no known notable events for this encounter.   Last Vitals:  Vitals:   06/25/23 1245 06/25/23 1252  BP: (!) 122/57 119/61  Pulse: 81 79  Resp: 10 13  Temp:  36.9 C  SpO2: 96% 97%    Last Pain:  Vitals:   06/25/23 1252  TempSrc: Oral  PainSc: 0-No pain                 Tahnee Cifuentes L Kashawna Manzer

## 2023-06-26 ENCOUNTER — Encounter (HOSPITAL_COMMUNITY): Payer: Self-pay | Admitting: Urology

## 2023-07-02 ENCOUNTER — Telehealth: Payer: Self-pay

## 2023-07-02 NOTE — Telephone Encounter (Signed)
 Patient wants to know how she is to follow up post ureteroscopy.  Patient has pulled tethered stent and is doing well at this time.  Follow up scheduled for 07/13/2023.  Will she need imaging prior to follow up?

## 2023-07-03 ENCOUNTER — Other Ambulatory Visit: Payer: Self-pay

## 2023-07-03 DIAGNOSIS — N200 Calculus of kidney: Secondary | ICD-10-CM

## 2023-07-07 NOTE — Telephone Encounter (Signed)
 Verbal from Dr. Claretta Croft to have patient follow up in 6 weeks with US .  Imaging ordered and patient notified via mycart.

## 2023-07-29 ENCOUNTER — Ambulatory Visit (HOSPITAL_COMMUNITY)
Admission: RE | Admit: 2023-07-29 | Discharge: 2023-07-29 | Disposition: A | Discharge status: Home / Self Care | Source: Ambulatory Visit | Attending: Urology | Admitting: Urology

## 2023-07-29 DIAGNOSIS — N200 Calculus of kidney: Secondary | ICD-10-CM | POA: Insufficient documentation

## 2023-08-12 ENCOUNTER — Ambulatory Visit (INDEPENDENT_AMBULATORY_CARE_PROVIDER_SITE_OTHER): Admitting: Urology

## 2023-08-12 ENCOUNTER — Encounter: Payer: Self-pay | Admitting: Urology

## 2023-08-12 VITALS — BP 126/76 | HR 84

## 2023-08-12 DIAGNOSIS — N200 Calculus of kidney: Secondary | ICD-10-CM

## 2023-08-12 DIAGNOSIS — Z87440 Personal history of urinary (tract) infections: Secondary | ICD-10-CM

## 2023-08-12 DIAGNOSIS — N3000 Acute cystitis without hematuria: Secondary | ICD-10-CM

## 2023-08-12 DIAGNOSIS — R82998 Other abnormal findings in urine: Secondary | ICD-10-CM

## 2023-08-12 LAB — URINALYSIS, ROUTINE W REFLEX MICROSCOPIC
Bilirubin, UA: NEGATIVE
Glucose, UA: NEGATIVE
Ketones, UA: NEGATIVE
Nitrite, UA: NEGATIVE
Protein,UA: NEGATIVE
RBC, UA: NEGATIVE
Specific Gravity, UA: 1.01 (ref 1.005–1.030)
Urobilinogen, Ur: 0.2 mg/dL (ref 0.2–1.0)
pH, UA: 7 (ref 5.0–7.5)

## 2023-08-12 LAB — MICROSCOPIC EXAMINATION

## 2023-08-12 MED ORDER — DOXYCYCLINE HYCLATE 100 MG PO CAPS: 100.0000 mg | ORAL_CAPSULE | Freq: Two times a day (BID) | ORAL | 0 refills | Status: DC

## 2023-08-12 NOTE — Progress Notes (Signed)
 08/12/2023 2:06 PM   Lorelie Rohrer 1946-02-20 [id number]  Referring provider: Linford Ribas, MD [address] [place],  [place] [address]  Followup nephrolithiasis   HPI: Ms Raether is a 78JY here for followup for UTI and nephrolithiasis. Renal US  shows no right renal calculi and multiple left 5-67mm calculi. She denies any flank pain.She has passed a couple of small calculi 2 weeks ago. For the past 3-4 days she has noted increased urinary frequency and urgency. UA is concerning for infection   PMH: Past Medical History:  Diagnosis Date   Acute pyelonephritis 01/24/2018   Anxiety    Arthritis    Depression    Diabetes mellitus without complication (HCC)    Dyslipidemia    History of kidney stones    Hypertension    Iron deficiency anemia due to chronic blood loss    Mucous membrane pemphigoid 2014   so far only infects mouth. treated with oral steroids for flares. Lidex as well.    Sleep apnea    SVT (supraventricular tachycardia) (HCC) 02/2023    Surgical History: Past Surgical History:  Procedure Laterality Date   BIOPSY  11/17/2017   Procedure: BIOPSY;  Surgeon: Alyce Jubilee, MD;  [facility]: [facility];  Service: Endoscopy;;  gastric duodenum   CESAREAN SECTION     twice   CHOLECYSTECTOMY     COLONOSCOPY  10/2012   Dr. Myrlene Asper: normal. due 10/2017.    COLONOSCOPY WITH PROPOFOL  N/A 11/17/2017   Procedure: COLONOSCOPY WITH PROPOFOL ;  Surgeon: Alyce Jubilee, MD;  [facility]: [facility];  Service: Endoscopy;  Laterality: N/A;  8:45am   CYSTOSCOPY WITH RETROGRADE PYELOGRAM, URETEROSCOPY AND STENT PLACEMENT Left 12/04/2022   Procedure: CYSTOSCOPY WITH RETROGRADE PYELOGRAM, URETEROSCOPY AND STENT PLACEMENT;  Surgeon: Marco Severs, MD;  Location: [facility];  Service: Urology;  Laterality: Left;   CYSTOSCOPY WITH RETROGRADE PYELOGRAM, URETEROSCOPY AND STENT PLACEMENT Left 01/22/2023   Procedure: CYSTOSCOPY WITH RETROGRADE PYELOGRAM, URETEROSCOPY AND STENT PLACEMENT;   Surgeon: Marco Severs, MD;  Location: [facility];  Service: Urology;  Laterality: Left;   CYSTOSCOPY WITH RETROGRADE PYELOGRAM, URETEROSCOPY AND STENT PLACEMENT Right 06/25/2023   Procedure: CYSTOURETEROSCOPY, WITH RETROGRADE PYELOGRAM AND STENT INSERTION;  Surgeon: Marco Severs, MD;  Location: [facility];  Service: Urology;  Laterality: Right;   ESOPHAGOGASTRODUODENOSCOPY (EGD) WITH PROPOFOL  N/A 11/17/2017   Procedure: ESOPHAGOGASTRODUODENOSCOPY (EGD) WITH PROPOFOL ;  Surgeon: Alyce Jubilee, MD;  [facility];  Service: Endoscopy;  Laterality: N/A;   GIVENS CAPSULE STUDY N/A 11/30/2017   Procedure: GIVENS CAPSULE STUDY;  Surgeon: Alyce Jubilee, MD;  [facility];  Service: Endoscopy;  Laterality: N/A;  7:30am   HOLMIUM LASER APPLICATION Left 12/04/2022   Procedure: HOLMIUM LASER APPLICATION;  Surgeon: Marco Severs, MD;  Location: [facility];  Service: Urology;  Laterality: Left;   HOLMIUM LASER APPLICATION Left 01/22/2023   Procedure: HOLMIUM LASER APPLICATION;  Surgeon: Marco Severs, MD;  Location: [facility];  Service: Urology;  Laterality: Left;   HOLMIUM LASER APPLICATION Right 06/25/2023   Procedure: HOLMIUM LASER APPLICATION;  Surgeon: Marco Severs, MD;  Location: [facility];  Service: Urology;  Laterality: Right;   LITHOTRIPSY  2017   POLYPECTOMY  11/17/2017   Procedure: POLYPECTOMY;  Surgeon: Alyce Jubilee, MD;  [facility];  Service: Endoscopy;;  colon    REPLACEMENT TOTAL KNEE BILATERAL     TONSILLECTOMY     TOTAL HIP ARTHROPLASTY Right  Home Medications:  Allergies as of 08/12/2023       Reactions   Nsaids Itching, Rash   If given in large doses.    Sulfa Antibiotics Rash   Erythromycin Hives, Rash   Tamsulosin Itching, Rash   Tape Itching, Rash        Medication List        Accurate as of Aug 12, 2023  2:06 PM. If you have any questions, ask your nurse or doctor.          acetaminophen  650 MG CR tablet Commonly  known as: TYLENOL  Take 1,300 mg by mouth daily as needed for pain.   alfuzosin  10 MG 24 hr tablet Commonly known as: UROXATRAL  Take 1 tablet (10 mg total) by mouth at bedtime.   aspirin EC 81 MG tablet Take 81 mg by mouth in the morning. Swallow whole.   BIOTIN PO Take 10,000 mcg by mouth in the morning.   COLLAGEN PO Take 1 Scoop by mouth in the morning.   desvenlafaxine 50 MG 24 hr tablet Commonly known as: PRISTIQ Take 50 mg by mouth in the morning.   doxycycline  100 MG capsule Commonly known as: VIBRAMYCIN  Take 1 capsule (100 mg total) by mouth every 12 (twelve) hours.   estradiol 0.1 MG/GM vaginal cream Commonly known as: ESTRACE Place 1 Applicatorful vaginally 2 (two) times a week. Sundays and Wednesdays   Fish Oil 1200 MG Caps Take 1,200 mg by mouth 2 (two) times daily.   HYDROcodone -acetaminophen  5-325 MG tablet Commonly known as: NORCO/VICODIN Take 1 tablet by mouth every 6 (six) hours as needed for moderate pain (pain score 4-6) or severe pain (pain score 7-10).   indapamide  2.5 MG tablet Commonly known as: LOZOL  Take 1 tablet (2.5 mg total) by mouth daily.   metoprolol  tartrate 25 MG tablet Commonly known as: LOPRESSOR  Take 1 tablet (25 mg total) by mouth 2 (two) times daily.   polyethylene glycol 17 g packet Commonly known as: MIRALAX  / GLYCOLAX  Take 17 g by mouth in the morning.   Potassium Citrate  15 MEQ (1620 MG) Tbcr Commonly known as: Urocit-K  15 Take 1 tablet by mouth 2 (two) times daily.   PRESERVISION AREDS PO Take 1 capsule by mouth 2 (two) times daily.   PROBIOTIC-PREBIOTIC PO Take 1 capsule by mouth every evening. 31 billion CFU   tirzepatide 15 MG/0.5ML Pen Commonly known as: MOUNJARO Inject 15 mg into the skin every Sunday.   TURMERIC CURCUMIN PO Take 900 mg by mouth 2 (two) times daily.        Allergies:  Allergies  Allergen Reactions   Nsaids Itching and Rash    If given in large doses.     Sulfa Antibiotics Rash    Erythromycin Hives and Rash   Tamsulosin Itching and Rash   Tape Itching and Rash    Family History: Family History  Problem Relation Age of Onset   Liver cancer Mother        ?started in pancreas   Parkinson's disease Father    Pneumonia Father    Colon cancer Father 64       metastatic but did well   Thyroid cancer Father    Other Daughter 10       brain tumor, followed with serial MRIs. now age 61   Colon cancer Paternal Aunt 52    Social History:  reports that she quit smoking about 55 years ago. Her smoking use included cigarettes. She started smoking about 56  years ago. She has a 0.3 pack-year smoking history. She has never used smokeless tobacco. She reports current alcohol use. She reports that she does not use drugs.  ROS: All other review of systems were reviewed and are negative except what is noted above in HPI  Physical Exam: BP 126/76   Pulse 84   Constitutional:  Alert and oriented, No acute distress. HEENT:  AT, moist mucus membranes.  Trachea midline, no masses. Cardiovascular: No clubbing, cyanosis, or edema. Respiratory: Normal respiratory effort, no increased work of breathing. GI: Abdomen is soft, nontender, nondistended, no abdominal masses GU: No CVA tenderness.  Lymph: No cervical or inguinal lymphadenopathy. Skin: No rashes, bruises or suspicious lesions. Neurologic: Grossly intact, no focal deficits, moving all 4 extremities. Psychiatric: Normal mood and affect.  Laboratory Data: Lab Results  Component Value Date   WBC 10.6 (H) 06/01/2023   HGB 14.3 06/01/2023   HCT 43.8 06/01/2023   MCV 90.1 06/01/2023   PLT 311 06/01/2023    Lab Results  Component Value Date   CREATININE 0.81 06/01/2023    No results found for: "PSA"  No results found for: "TESTOSTERONE"  Lab Results  Component Value Date   HGBA1C 5.2 06/01/2023    Urinalysis    Component Value Date/Time   COLORURINE YELLOW 10/23/2022 1856   APPEARANCEUR Cloudy (A)  06/02/2023 1359   LABSPEC 1.010 10/23/2022 1856   PHURINE 6.0 10/23/2022 1856   GLUCOSEU Negative 06/02/2023 1359   HGBUR MODERATE (A) 10/23/2022 1856   BILIRUBINUR Negative 06/02/2023 1359   KETONESUR NEGATIVE 10/23/2022 1856   PROTEINUR 1+ (A) 06/02/2023 1359   PROTEINUR 100 (A) 10/23/2022 1856   NITRITE Negative 06/02/2023 1359   NITRITE NEGATIVE 10/23/2022 1856   LEUKOCYTESUR 3+ (A) 06/02/2023 1359   LEUKOCYTESUR LARGE (A) 10/23/2022 1856    Lab Results  Component Value Date   LABMICR See below: 06/02/2023   WBCUA >30 (A) 06/02/2023   LABEPIT 0-10 06/02/2023   BACTERIA Few (A) 06/02/2023    Pertinent Imaging: Renal US  07/29/2023: Images reviewed and discussed with the patient Results for orders placed during the hospital encounter of 05/18/23  Abdomen 1 view (KUB)  Narrative CLINICAL DATA:  Nephrolithiasis.  EXAM: ABDOMEN - 1 VIEW  COMPARISON:  CT 12/17/2022  FINDINGS: 13 mm stone projects over the upper right kidney. Punctate stone projects over the lower right kidney. The previous left renal stones are obscured by overlying bowel gas on the current exam, potentially visualized. No bowel dilatation to suggest obstruction. Air-filled stomach which is elongated.  IMPRESSION: 1. Right renal stones measuring up to 13 mm. 2. The previous left renal stones are obscured by overlying bowel gas on the current exam, potentially visualized.   Electronically Signed By: Chadwick Colonel M.D. On: 06/06/2023 12:16  No results found for this or any previous visit.  No results found for this or any previous visit.  No results found for this or any previous visit.  Results for orders placed during the hospital encounter of 07/29/23  US  RENAL  Narrative CLINICAL DATA:  History of kidney stones.  EXAM: RENAL / URINARY TRACT ULTRASOUND COMPLETE  COMPARISON:  Renal ultrasound February 05, 2023, CT abdomen pelvis December 17, 2022  FINDINGS: Right  Kidney:  Renal measurements: 11.4 x 3.6 x 5.5 cm = volume: 118.2 mL. Echogenicity within normal limits. No mass or hydronephrosis visualized.  Left Kidney:  Renal measurements: 12.5 x 5 6 x 5.3 cm = volume: 195.2 mL. Several nonobstructing stones are  identified, largest measures 11.2 mm. Echogenicity within normal limits. No mass or hydronephrosis visualized.  Bladder:  Appears normal for degree of bladder distention.  Other:  None.  IMPRESSION: Nonobstructing left renal stones. No hydronephrosis bilaterally.   Electronically Signed By: Anna Barnes M.D. On: 07/29/2023 16:09  No results found for this or any previous visit.  No results found for this or any previous visit.  Results for orders placed in visit on 12/17/22  CT RENAL STONE STUDY  Narrative CLINICAL DATA:  Urinary tract calculi.  EXAM: CT ABDOMEN AND PELVIS WITHOUT CONTRAST  TECHNIQUE: Multidetector CT imaging of the abdomen and pelvis was performed following the standard protocol without IV contrast.  RADIATION DOSE REDUCTION: This exam was performed according to the departmental dose-optimization program which includes automated exposure control, adjustment of the mA and/or kV according to patient size and/or use of iterative reconstruction technique.  COMPARISON:  01/24/2018 and abdomen radiograph from 10/22/2022  FINDINGS: Lower chest: Stable 5 by 3 mm subpleural nodule in the left lower lobe on image 19 series 4, stability over the last 5 years implies benign etiology. No further imaging workup of this lesion is indicated.  Mitral valve calcification.  Hepatobiliary: Cholecystectomy.  Otherwise unremarkable.  Pancreas: Unremarkable  Spleen: Unremarkable  Adrenals/Urinary Tract: 1.5 cm Bosniak category 1 cyst of the right kidney lower pole anteriorly on image 43 series 2. No further imaging workup of this lesion is indicated.  Obscuration of the right distal ureter and right  side of the urinary bladder due to streak artifact from the patient's right hip implant.  Three nonobstructive right renal calculi are present, largest measures 1.1 cm in the upper pole.  Three nonobstructive left kidney lower pole calculi are observed with some staghorn characteristics, the largest is 1.6 cm in the lower pole.  Left double-J ureteral stent is present, no definite ureteral stone along the margin of the stent identified. Minimal caliectasis on the left without hydroureter. Stent positioning satisfactory.  Stomach/Bowel: Lax anterior abdominal wall with rectus diastasis, abdominal contents overhang the proximal femurs through this laxity along the pannus. Air fluid levels in the sigmoid colon which could reflect diarrheal process. No dilated bowel.  Vascular/Lymphatic: Atherosclerosis is present, including aortoiliac atherosclerotic disease.  Reproductive: Unremarkable  Other: No supplemental non-categorized findings.  Musculoskeletal: Right total hip prosthesis. 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 Grade 1 degenerative retrolisthesis at L2-3 and grade 1 degenerative anterolisthesis at L4-5.  IMPRESSION: 1. Bilateral nonobstructive nephrolithiasis. 2. Left double-J ureteral stent in place, no definite ureteral stone along the margin of the stent identified. Minimal caliectasis on the left without hydroureter. 3. Air fluid levels in the sigmoid colon which could reflect diarrheal process. 4. Aortic atherosclerosis. 5. 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 6. Lax anterior abdominal wall with rectus diastasis, abdominal contents overhang the proximal femurs through this laxity along the pannus.  Aortic Atherosclerosis (ICD10-I70.0).   Electronically Signed By: Freida Jes M.D. On: 12/17/2022 13:43   Assessment & Plan:     1. Nephrolithiasis (Primary) Ct stone study, will call with results. If normal I will see her back in 3 months with renal US  - Urinalysis, Routine w reflex microscopic  2. Acute cystitis without hematuria Urine for culture Doxycycline  100mg  for 7 days   No follow-ups on file.  Johnie Nailer, MD  [facility] [place]

## 2023-08-12 NOTE — Patient Instructions (Signed)

## 2023-08-16 LAB — URINE CULTURE

## 2023-08-18 ENCOUNTER — Ambulatory Visit: Payer: Self-pay

## 2023-08-18 MED ORDER — CIPROFLOXACIN HCL 250 MG PO TABS: 250.0000 mg | ORAL_TABLET | Freq: Two times a day (BID) | ORAL | 0 refills | Status: DC

## 2023-08-18 NOTE — Telephone Encounter (Signed)
 Antibiotic sent and patient called and made aware to stop doxycycline  and start cipro . Patient voiced understanding.

## 2023-08-18 NOTE — Telephone Encounter (Signed)
-----   Message from Johnie Nailer sent at 08/18/2023  8:13 AM EDT ----- Cipro  250mg  BID for 7 days ----- Message ----- From: Garner Jury Lab Results In Sent: 08/12/2023   3:35 PM EDT To: Marco Severs, MD

## 2023-08-24 ENCOUNTER — Encounter (HOSPITAL_COMMUNITY): Payer: Self-pay

## 2023-08-24 ENCOUNTER — Ambulatory Visit (HOSPITAL_COMMUNITY)
Admission: RE | Admit: 2023-08-24 | Discharge: 2023-08-24 | Disposition: A | Discharge status: Home / Self Care | Source: Ambulatory Visit | Attending: Urology | Admitting: Urology

## 2023-08-24 DIAGNOSIS — N200 Calculus of kidney: Secondary | ICD-10-CM | POA: Insufficient documentation

## 2023-10-23 ENCOUNTER — Other Ambulatory Visit: Payer: Self-pay | Admitting: Internal Medicine

## 2023-11-18 ENCOUNTER — Ambulatory Visit: Admitting: Urology

## 2023-11-18 ENCOUNTER — Other Ambulatory Visit

## 2023-11-18 ENCOUNTER — Telehealth: Payer: Self-pay | Admitting: Urology

## 2023-11-18 DIAGNOSIS — R399 Unspecified symptoms and signs involving the genitourinary system: Secondary | ICD-10-CM

## 2023-11-18 LAB — URINALYSIS, ROUTINE W REFLEX MICROSCOPIC
Bilirubin, UA: NEGATIVE
Glucose, UA: NEGATIVE
Ketones, UA: NEGATIVE
Nitrite, UA: NEGATIVE
Protein,UA: NEGATIVE
Specific Gravity, UA: 1.01 (ref 1.005–1.030)
Urobilinogen, Ur: 0.2 mg/dL (ref 0.2–1.0)
pH, UA: 7.5 (ref 5.0–7.5)

## 2023-11-18 LAB — MICROSCOPIC EXAMINATION

## 2023-11-18 MED ORDER — DOXYCYCLINE HYCLATE 100 MG PO CAPS: 100.0000 mg | ORAL_CAPSULE | Freq: Two times a day (BID) | ORAL | 0 refills | Status: DC

## 2023-11-18 NOTE — Telephone Encounter (Signed)
 Patient presents today with complaints of  UTI.  UA and Culture done today.  Dr. Sherrilee  reviewed results and gave verbal to send in  Doxycycline   .  Patient aware of MD recommendations and that we will reach out with culture results.      Dyjwpvlj, CMA

## 2023-11-18 NOTE — Telephone Encounter (Signed)
   Urologic History:  Any Recent Urologic Surgeries or Procedures:yes-Right retrograde pyelography  Recurrent UTI's:yes Cystitis: yes  Prostatitis:no Kidney or Bladder Stones: yes=kidney stones Plan: Walk-in Clinic: no Appointment w/Physician: [no Lab visit scheduled for urine drop off: Yes Advice given:n/a  Do you take on daily medications for UTI suppression No

## 2023-11-18 NOTE — Telephone Encounter (Signed)
 Patient is made aware urine appears infected and Rx sent to Pharmacy. Voiced understanding.

## 2023-11-18 NOTE — Telephone Encounter (Signed)
 Wants to drop off urine thinks uti   Dysuria  Patient called with c/o dysuria x 3-5 days days.  Pain: burning  Severity:8/10  Associated Signs and Symptoms:  Fever: noTemp. Chills: no Hematuria: no Urgency: yes Frequency: yes Hesitancy:no Incontinence: no Nausea: no Vomiting: no  Message sent to clinic staff to return call to patient to advise of plan.

## 2023-11-21 LAB — URINE CULTURE

## 2023-11-24 ENCOUNTER — Ambulatory Visit: Payer: Self-pay

## 2023-11-24 MED ORDER — AMOXICILLIN-POT CLAVULANATE 875-125 MG PO TABS: 1.0000 | ORAL_TABLET | Freq: Two times a day (BID) | ORAL | 0 refills | Status: DC

## 2023-11-24 NOTE — Telephone Encounter (Signed)
Patient called and made aware of positive urine culture and new antibiotic sent to pharmacy.

## 2023-11-24 NOTE — Telephone Encounter (Signed)
-----   Message from Belvie Clara sent at 11/24/2023  8:33 AM EDT ----- Change antibiotic to augmentin  875 bid for 7 dyas ----- Message ----- From: Rebecka Memos Lab Results In Sent: 11/18/2023   3:36 PM EDT To: Belvie LITTIE Clara, MD

## 2023-12-14 ENCOUNTER — Ambulatory Visit (HOSPITAL_COMMUNITY)
Admission: RE | Admit: 2023-12-14 | Discharge: 2023-12-14 | Disposition: A | Discharge status: Home / Self Care | Source: Ambulatory Visit | Attending: Urology | Admitting: Urology

## 2023-12-14 DIAGNOSIS — N200 Calculus of kidney: Secondary | ICD-10-CM | POA: Insufficient documentation

## 2024-01-13 ENCOUNTER — Ambulatory Visit: Admitting: Urology

## 2024-01-13 VITALS — BP 90/56 | HR 84

## 2024-01-13 DIAGNOSIS — N200 Calculus of kidney: Secondary | ICD-10-CM

## 2024-01-13 DIAGNOSIS — Z87442 Personal history of urinary calculi: Secondary | ICD-10-CM

## 2024-01-13 DIAGNOSIS — Z87440 Personal history of urinary (tract) infections: Secondary | ICD-10-CM | POA: Diagnosis not present

## 2024-01-13 DIAGNOSIS — Z09 Encounter for follow-up examination after completed treatment for conditions other than malignant neoplasm: Secondary | ICD-10-CM | POA: Diagnosis not present

## 2024-01-13 DIAGNOSIS — N3001 Acute cystitis with hematuria: Secondary | ICD-10-CM

## 2024-01-13 DIAGNOSIS — N3000 Acute cystitis without hematuria: Secondary | ICD-10-CM

## 2024-01-13 LAB — URINALYSIS, ROUTINE W REFLEX MICROSCOPIC
Bilirubin, UA: NEGATIVE
Glucose, UA: NEGATIVE
Ketones, UA: NEGATIVE
Leukocytes,UA: NEGATIVE
Nitrite, UA: NEGATIVE
Protein,UA: NEGATIVE
RBC, UA: NEGATIVE
Specific Gravity, UA: 1.01 (ref 1.005–1.030)
Urobilinogen, Ur: 0.2 mg/dL (ref 0.2–1.0)
pH, UA: 7.5 (ref 5.0–7.5)

## 2024-01-13 MED ORDER — INDAPAMIDE 2.5 MG PO TABS: 2.5000 mg | ORAL_TABLET | Freq: Every day | ORAL | 11 refills | Status: AC

## 2024-01-13 MED ORDER — POTASSIUM CITRATE ER 15 MEQ (1620 MG) PO TBCR: 1.0000 | EXTENDED_RELEASE_TABLET | Freq: Two times a day (BID) | ORAL | 11 refills | Status: AC

## 2024-01-13 NOTE — Progress Notes (Signed)
 01/13/2024 10:18 AM   Felicia Hogan 01-26-1946 [id number]  Referring provider: Diedra Senior, MD [address] [place],  [place] [id number]  Followup nephrolithiasis   HPI:  Felicia Hogan is a 25bn here for followup for nephrolithiasis and frequent UTI. No UTIs since last visit. No stone events since last visit. No flank pain. Renal US  shows no calculi. She is on indapamide  and urocitK.   PMH: Past Medical History:  Diagnosis Date   Acute pyelonephritis 01/24/2018   Anxiety    Arthritis    Depression    Diabetes mellitus without complication (HCC)    Dyslipidemia    History of kidney stones    Hypertension    Iron deficiency anemia due to chronic blood loss    Mucous membrane pemphigoid 2014   so far only infects mouth. treated with oral steroids for flares. Lidex as well.    Sleep apnea    SVT (supraventricular tachycardia) 02/2023    Surgical History: Past Surgical History:  Procedure Laterality Date   BIOPSY  11/17/2017   Procedure: BIOPSY;  Surgeon: Harvey Margo CROME, MD;  [facility]: [facility];  Service: Endoscopy;;  gastric duodenum   CESAREAN SECTION     twice   CHOLECYSTECTOMY     COLONOSCOPY  10/2012   Dr. Rhoda: normal. due 10/2017.    COLONOSCOPY WITH PROPOFOL  N/A 11/17/2017   Procedure: COLONOSCOPY WITH PROPOFOL ;  Surgeon: Harvey Margo CROME, MD;  [facility]: [facility];  Service: Endoscopy;  Laterality: N/A;  8:45am   CYSTOSCOPY WITH RETROGRADE PYELOGRAM, URETEROSCOPY AND STENT PLACEMENT Left 12/04/2022   Procedure: CYSTOSCOPY WITH RETROGRADE PYELOGRAM, URETEROSCOPY AND STENT PLACEMENT;  Surgeon: Sherrilee Belvie CROME, MD;  Location: [facility];  Service: Urology;  Laterality: Left;   CYSTOSCOPY WITH RETROGRADE PYELOGRAM, URETEROSCOPY AND STENT PLACEMENT Left 01/22/2023   Procedure: CYSTOSCOPY WITH RETROGRADE PYELOGRAM, URETEROSCOPY AND STENT PLACEMENT;  Surgeon: Sherrilee Belvie CROME, MD;  Location: [facility];  Service: Urology;  Laterality: Left;   CYSTOSCOPY WITH  RETROGRADE PYELOGRAM, URETEROSCOPY AND STENT PLACEMENT Right 06/25/2023   Procedure: CYSTOURETEROSCOPY, WITH RETROGRADE PYELOGRAM AND STENT INSERTION;  Surgeon: Sherrilee Belvie CROME, MD;  Location: [facility];  Service: Urology;  Laterality: Right;   ESOPHAGOGASTRODUODENOSCOPY (EGD) WITH PROPOFOL  N/A 11/17/2017   Procedure: ESOPHAGOGASTRODUODENOSCOPY (EGD) WITH PROPOFOL ;  Surgeon: Harvey Margo CROME, MD;  [facility];  Service: Endoscopy;  Laterality: N/A;   GIVENS CAPSULE STUDY N/A 11/30/2017   Procedure: GIVENS CAPSULE STUDY;  Surgeon: Harvey Margo CROME, MD;  [facility]: [facility];  Service: Endoscopy;  Laterality: N/A;  7:30am   HOLMIUM LASER APPLICATION Left 12/04/2022   Procedure: HOLMIUM LASER APPLICATION;  Surgeon: Sherrilee Belvie CROME, MD;  Location: [facility];  Service: Urology;  Laterality: Left;   HOLMIUM LASER APPLICATION Left 01/22/2023   Procedure: HOLMIUM LASER APPLICATION;  Surgeon: Sherrilee Belvie CROME, MD;  Location: [facility];  Service: Urology;  Laterality: Left;   HOLMIUM LASER APPLICATION Right 06/25/2023   Procedure: HOLMIUM LASER APPLICATION;  Surgeon: Sherrilee Belvie CROME, MD;  Location: [facility];  Service: Urology;  Laterality: Right;   LITHOTRIPSY  2017   POLYPECTOMY  11/17/2017   Procedure: POLYPECTOMY;  Surgeon: Harvey Margo CROME, MD;  [facility];  Service: Endoscopy;;  colon    REPLACEMENT TOTAL KNEE BILATERAL     TONSILLECTOMY     TOTAL HIP ARTHROPLASTY Right     Home Medications:  Allergies as of 01/13/2024       Reactions   Nsaids Itching, Rash  If given in large doses.    Sulfa Antibiotics Rash   Erythromycin Hives, Rash   Tamsulosin Itching, Rash   Tape Itching, Rash        Medication List        Accurate as of January 13, 2024 10:18 AM. If you have any questions, ask your nurse or doctor.          acetaminophen  650 MG CR tablet Commonly known as: TYLENOL  Take 1,300 mg by mouth daily as needed for pain.   alfuzosin  10 MG 24 hr  tablet Commonly known as: UROXATRAL  Take 1 tablet (10 mg total) by mouth at bedtime.   amoxicillin -clavulanate 875-125 MG tablet Commonly known as: AUGMENTIN  Take 1 tablet by mouth every 12 (twelve) hours.   aspirin EC 81 MG tablet Take 81 mg by mouth in the morning. Swallow whole.   BIOTIN PO Take 10,000 mcg by mouth in the morning.   ciprofloxacin  250 MG tablet Commonly known as: Cipro  Take 1 tablet (250 mg total) by mouth 2 (two) times daily.   COLLAGEN PO Take 1 Scoop by mouth in the morning.   desvenlafaxine 50 MG 24 hr tablet Commonly known as: PRISTIQ Take 50 mg by mouth in the morning.   doxycycline  100 MG capsule Commonly known as: VIBRAMYCIN  Take 1 capsule (100 mg total) by mouth every 12 (twelve) hours.   doxycycline  100 MG capsule Commonly known as: VIBRAMYCIN  Take 1 capsule (100 mg total) by mouth every 12 (twelve) hours.   estradiol 0.1 MG/GM vaginal cream Commonly known as: ESTRACE Place 1 Applicatorful vaginally 2 (two) times a week. Sundays and Wednesdays   Fish Oil 1200 MG Caps Take 1,200 mg by mouth 2 (two) times daily.   HYDROcodone -acetaminophen  5-325 MG tablet Commonly known as: NORCO/VICODIN Take 1 tablet by mouth every 6 (six) hours as needed for moderate pain (pain score 4-6) or severe pain (pain score 7-10).   indapamide  2.5 MG tablet Commonly known as: LOZOL  Take 1 tablet (2.5 mg total) by mouth daily.   metoprolol  tartrate 25 MG tablet Commonly known as: LOPRESSOR  Take 1 tablet by mouth twice daily   polyethylene glycol 17 g packet Commonly known as: MIRALAX  / GLYCOLAX  Take 17 g by mouth in the morning.   Potassium Citrate  15 MEQ (1620 MG) Tbcr Commonly known as: Urocit-K  15 Take 1 tablet by mouth 2 (two) times daily.   PRESERVISION AREDS PO Take 1 capsule by mouth 2 (two) times daily.   PROBIOTIC-PREBIOTIC PO Take 1 capsule by mouth every evening. 31 billion CFU   tirzepatide 15 MG/0.5ML Pen Commonly known as:  MOUNJARO Inject 15 mg into the skin every Sunday.   TURMERIC CURCUMIN PO Take 900 mg by mouth 2 (two) times daily.        Allergies:  Allergies  Allergen Reactions   Nsaids Itching and Rash    If given in large doses.     Sulfa Antibiotics Rash   Erythromycin Hives and Rash   Tamsulosin Itching and Rash   Tape Itching and Rash    Family History: Family History  Problem Relation Age of Onset   Liver cancer Mother        ?started in pancreas   Parkinson's disease Father    Pneumonia Father    Colon cancer Father 38       metastatic but did well   Thyroid cancer Father    Other Daughter 10       brain tumor, followed with serial  MRIs. now age 79   Colon cancer Paternal Aunt 31    Social History:  reports that she quit smoking about 56 years ago. Her smoking use included cigarettes. She started smoking about 57 years ago. She has a 0.3 pack-year smoking history. She has never used smokeless tobacco. She reports current alcohol use. She reports that she does not use drugs.  ROS: All other review of systems were reviewed and are negative except what is noted above in HPI  Physical Exam: BP (!) 90/56   Pulse 84   Constitutional:  Alert and oriented, No acute distress. HEENT: [place] AT, moist mucus membranes.  Trachea midline, no masses. Cardiovascular: No clubbing, cyanosis, or edema. Respiratory: Normal respiratory effort, no increased work of breathing. GI: Abdomen is soft, nontender, nondistended, no abdominal masses GU: No CVA tenderness.  Lymph: No cervical or inguinal lymphadenopathy. Skin: No rashes, bruises or suspicious lesions. Neurologic: Grossly intact, no focal deficits, moving all 4 extremities. Psychiatric: Normal mood and affect.  Laboratory Data: Lab Results  Component Value Date   WBC 10.6 (H) 06/01/2023   HGB 14.3 06/01/2023   HCT 43.8 06/01/2023   MCV 90.1 06/01/2023   PLT 311 06/01/2023    Lab Results  Component Value Date   CREATININE  0.81 06/01/2023    No results found for: PSA  No results found for: TESTOSTERONE  Lab Results  Component Value Date   HGBA1C 5.2 06/01/2023    Urinalysis    Component Value Date/Time   COLORURINE YELLOW 10/23/2022 1856   APPEARANCEUR Clear 11/18/2023 1339   LABSPEC 1.010 10/23/2022 1856   PHURINE 6.0 10/23/2022 1856   GLUCOSEU Negative 11/18/2023 1339   HGBUR MODERATE (A) 10/23/2022 1856   BILIRUBINUR Negative 11/18/2023 1339   KETONESUR NEGATIVE 10/23/2022 1856   PROTEINUR Negative 11/18/2023 1339   PROTEINUR 100 (A) 10/23/2022 1856   NITRITE Negative 11/18/2023 1339   NITRITE NEGATIVE 10/23/2022 1856   LEUKOCYTESUR 2+ (A) 11/18/2023 1339   LEUKOCYTESUR LARGE (A) 10/23/2022 1856    Lab Results  Component Value Date   LABMICR See below: 11/18/2023   WBCUA 11-30 (A) 11/18/2023   LABEPIT 0-10 11/18/2023   BACTERIA Moderate (A) 11/18/2023    Pertinent Imaging: Renal US  12/14/2023: Images reviewed and discusse with the patient  Results for orders placed during the hospital encounter of 05/18/23  Abdomen 1 view (KUB)  Narrative CLINICAL DATA:  Nephrolithiasis.  EXAM: ABDOMEN - 1 VIEW  COMPARISON:  CT 12/17/2022  FINDINGS: 13 mm stone projects over the upper right kidney. Punctate stone projects over the lower right kidney. The previous left renal stones are obscured by overlying bowel gas on the current exam, potentially visualized. No bowel dilatation to suggest obstruction. Air-filled stomach which is elongated.  IMPRESSION: 1. Right renal stones measuring up to 13 mm. 2. The previous left renal stones are obscured by overlying bowel gas on the current exam, potentially visualized.   Electronically Signed By: Andrea Gasman M.D. On: 06/06/2023 12:16  No results found for this or any previous visit.  No results found for this or any previous visit.  No results found for this or any previous visit.  Results for orders placed during the  hospital encounter of 08/24/23  Ultrasound renal complete  Narrative EXAM: US  Retroperitoneum Complete, Renal.  CLINICAL HISTORY: nephrolithiasis.  TECHNIQUE: Real-time ultrasound of the retroperitoneum (complete) with image documentation.  COMPARISON: None provided.  FINDINGS:  RIGHT KIDNEY: The right kidney measures 9.9 x 4.5 x 4.8 cm  with an estimated volume of 112 cc. There is asymmetric cortical atrophy of the right kidney involving the upper and lower pole. No hydronephrosis, renal stone, or mass visualized.  LEFT KIDNEY: The left kidney measures 11.6 x 6.6 x 6.1 cm with an estimated volume of 249 cc. There is mild left hydronephrosis. Mild asymmetric cortical atrophy involving the upper pole of the left kidney. No intrarenal mass or calcifications are seen.  BLADDER: Unremarkable as visualized.  URETERS: Bilateral ureteral jets were identified.  IMPRESSION: 1. Mild left hydronephrosis. 2. Asymmetric cortical atrophy of the right kidney involving the upper and lower pole. 3. Mild asymmetric cortical atrophy involving the upper pole of the left kidney.  Electronically signed by: Dorethia Molt MD 12/18/2023 02:35 AM EDT RP Workstation: [id number]  No results found for this or any previous visit.  No results found for this or any previous visit.  Results for orders placed in visit on 08/12/23  CT RENAL STONE STUDY  Narrative EXAMINATION: CT RENAL STONE STUDY  CLINICAL INDICATION: Female, 78 years old. Stone burden eval, post treatment  TECHNIQUE: Helical CT scan examination of the abdomen and pelvis is performed from the domes of the diaphragm to the pubic symphysis. Limited evaluation of the solid organs due to lack of intravenous contrast. Unless otherwise specified, incidental thyroid, adrenal, renal lesions do not require dedicated imaging follow up. Additionally, any mentioned pulmonary nodules do not require dedicated imaging follow-up based on  the Fleischner guidelines unless otherwise specified. Coronary calcifications are not identified unless otherwise specified.  COMPARISON: 12/17/2022  FINDINGS:  The lung bases are clear. The heart is normal in size. The liver appears normal. The gallbladder is surgically absent. The spleen is normal. Pancreas is normal. The adrenals are normal. Small right renal cyst is seen. There are small bilateral nonobstructing intrarenal stones of the largest measuring 2 mm in the right kidney.  The abdominal aorta is normal in caliber. The urinary bladder and distal ureters are suboptimally imaged secondary to streak artifact from the right hip arthroplasty there are no obvious stones are seen. The uterus is normal. 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 There is no free fluid or pathologic lymphadenopathy by size criteria. There is diffuse osseous demineralization. There are degenerative changes of the spine and bony pelvis.  IMPRESSION:  Small bilateral nonobstructing intrarenal stones.  Additional findings as above.  DOSE REDUCTION: This exam was performed according to our departmental dose-optimization program which includes automated exposure control, adjustment of the mA and/or kV according to patient size and/or use of iterative reconstruction technique.  Electronically signed by: Chad Engel MD 08/24/2023 07:09 PM EDT RP Workstation: [id number]   Assessment & Plan:    1. Nephrolithiasis (Primary) Continue indapamide  and UrocitK.  Followup 6 months with a renal US  - Urinalysis, Routine w reflex microscopic  2. Acute cystitis without hematuria resolved   No follow-ups on file.  Belvie Clara, MD  [facility] [place]

## 2024-01-19 ENCOUNTER — Encounter: Payer: Self-pay | Admitting: Urology

## 2024-01-19 NOTE — Patient Instructions (Signed)

## 2024-04-14 ENCOUNTER — Other Ambulatory Visit: Payer: Self-pay | Admitting: Internal Medicine

## 2024-07-06 ENCOUNTER — Other Ambulatory Visit (HOSPITAL_COMMUNITY)

## 2024-07-08 ENCOUNTER — Ambulatory Visit: Admitting: Internal Medicine

## 2024-07-13 ENCOUNTER — Ambulatory Visit: Admitting: Urology
